# Patient Record
Sex: Male | Born: 1935 | Race: White | Hispanic: No | Smoking: Never smoker
Health system: Southern US, Community
[De-identification: ages and names within clinical notes are randomized; demographics above are authoritative.]

## PROBLEM LIST (undated history)

## (undated) DIAGNOSIS — E119 Type 2 diabetes mellitus without complications: Secondary | ICD-10-CM

## (undated) DIAGNOSIS — F0390 Unspecified dementia without behavioral disturbance: Secondary | ICD-10-CM

## (undated) DIAGNOSIS — I2699 Other pulmonary embolism without acute cor pulmonale: Secondary | ICD-10-CM

## (undated) HISTORY — PX: KIDNEY STONE SURGERY: SHX686

## (undated) HISTORY — PX: HERNIA REPAIR: SHX51

---

## 2021-02-13 ENCOUNTER — Other Ambulatory Visit: Payer: Self-pay

## 2021-02-13 DIAGNOSIS — F0390 Unspecified dementia without behavioral disturbance: Secondary | ICD-10-CM | POA: Insufficient documentation

## 2021-02-13 DIAGNOSIS — X58XXXA Exposure to other specified factors, initial encounter: Secondary | ICD-10-CM | POA: Diagnosis not present

## 2021-02-13 DIAGNOSIS — S5012XA Contusion of left forearm, initial encounter: Secondary | ICD-10-CM | POA: Diagnosis not present

## 2021-02-13 DIAGNOSIS — T148XXA Other injury of unspecified body region, initial encounter: Secondary | ICD-10-CM

## 2021-02-13 DIAGNOSIS — S4992XA Unspecified injury of left shoulder and upper arm, initial encounter: Secondary | ICD-10-CM | POA: Diagnosis present

## 2021-02-13 LAB — CBC WITH DIFFERENTIAL/PLATELET
Abs Immature Granulocytes: 0.01 10*3/uL (ref 0.00–0.07)
Basophils Absolute: 0 10*3/uL (ref 0.0–0.1)
Basophils Relative: 1 %
Eosinophils Absolute: 0.4 10*3/uL (ref 0.0–0.5)
Eosinophils Relative: 9 %
HCT: 33.8 % — ABNORMAL LOW (ref 39.0–52.0)
Hemoglobin: 11.8 g/dL — ABNORMAL LOW (ref 13.0–17.0)
Immature Granulocytes: 0 %
Lymphocytes Relative: 37 %
Lymphs Abs: 1.8 10*3/uL (ref 0.7–4.0)
MCH: 35.2 pg — ABNORMAL HIGH (ref 26.0–34.0)
MCHC: 34.9 g/dL (ref 30.0–36.0)
MCV: 100.9 fL — ABNORMAL HIGH (ref 80.0–100.0)
Monocytes Absolute: 0.5 10*3/uL (ref 0.1–1.0)
Monocytes Relative: 11 %
Neutro Abs: 2 10*3/uL (ref 1.7–7.7)
Neutrophils Relative %: 42 %
Platelets: 223 10*3/uL (ref 150–400)
RBC: 3.35 MIL/uL — ABNORMAL LOW (ref 4.22–5.81)
RDW: 12.2 % (ref 11.5–15.5)
WBC: 4.8 10*3/uL (ref 4.0–10.5)
nRBC: 0 % (ref 0.0–0.2)

## 2021-02-13 LAB — PROTIME-INR
INR: 2.4 — ABNORMAL HIGH (ref 0.8–1.2)
Prothrombin Time: 26 seconds — ABNORMAL HIGH (ref 11.4–15.2)

## 2021-02-13 NOTE — ED Triage Notes (Signed)
Pt from [facility] with left arm bruising. Pt has a collection of blood beneath skin noted to left distal forearm. Area is marked with marker. Pt with history of dementia, states he thinks began today.

## 2021-02-13 NOTE — ED Triage Notes (Signed)
Pt in via EMS from [facility]. Pt has dementia. Pt was talking to friends and staff saw some bruising on his arm and wanted him checked out. Pt takes blood thinners and has had no falls.

## 2021-02-13 NOTE — ED Provider Notes (Signed)
Emergency Medicine Provider Triage Evaluation Note  Andre Lewis , a 87 y.o. male  was evaluated in triage.  Pt complains of bruising to LUE, nontraumatic. Takes Plavix  Review of Systems  Positive: bruising Negative: pain  Physical Exam  BP 137/73   Pulse (!) 59   Temp 98.1 F (36.7 C) (Oral)   Resp 16   Ht 5\' 7"  (1.702 m)   Wt 72.6 kg   SpO2 99%   BMI 25.06 kg/m  Gen:   Awake, no distress   Resp:  Normal effort  MSK:   Moves extremities without difficulty; hematoma to left forearm Other:    Medical Decision Making  Medically screening exam initiated at 11:50 PM.  Appropriate orders placed.  Keilan Nichol was informed that the remainder of the evaluation will be completed by another provider, this initial triage assessment does not replace that evaluation, and the importance of remaining in the ED until their evaluation is complete.  87y/o M on Plavix presenting with nontraumatic LUE hematoma. Labs unremarkable; will obtain Rochel Brome soft tissue.   [place], MD 02/13/21 2351

## 2021-02-14 ENCOUNTER — Emergency Department: Payer: Medicare Other

## 2021-02-14 ENCOUNTER — Emergency Department
Admission: EM | Admit: 2021-02-14 | Discharge: 2021-02-14 | Disposition: A | Discharge status: Home / Self Care | Payer: Medicare Other | Attending: Emergency Medicine | Admitting: Emergency Medicine

## 2021-02-14 DIAGNOSIS — T148XXA Other injury of unspecified body region, initial encounter: Secondary | ICD-10-CM

## 2021-02-14 DIAGNOSIS — S5012XA Contusion of left forearm, initial encounter: Secondary | ICD-10-CM | POA: Diagnosis not present

## 2021-02-14 NOTE — ED Provider Notes (Signed)
[facility] Emergency Department Provider Note   ____________________________________________   Event Date/Time   First MD Initiated Contact with Patient 02/14/21 [phone]     (approximate)  I have reviewed the triage vital signs and the nursing notes.   HISTORY  Chief Complaint arm bruising    HPI Andre Lewis is a 87 y.o. male sent to the ED from [facility] with left arm bruising.  Patient with a history of dementia.  He happened to notice bruising on his left forearm today.  Denies known injury.  Takes anticoagulants but does not remember which one.  Staff marked the area with a marker and it has not extended beyond the lines over the course of the day.  Denies left forearm pain, swelling, warmth, redness.  Denies chest pain, shortness of breath, abdominal pain, nausea, vomiting or dizziness.     Past medical history Dementia  There are no problems to display for this patient.   Prior to Admission medications   Not on File    Allergies Patient has no known allergies.  No family history on file.  Social History    Review of Systems  Constitutional: No fever/chills Eyes: No visual changes. ENT: No sore throat. Cardiovascular: Denies chest pain. Respiratory: Denies shortness of breath. Gastrointestinal: No abdominal pain.  No nausea, no vomiting.  No diarrhea.  No constipation. Genitourinary: Negative for dysuria. Musculoskeletal: Positive for nontraumatic left forearm bruising.  Negative for back pain. Skin: Negative for rash. Neurological: Negative for headaches, focal weakness or numbness.   ____________________________________________   PHYSICAL EXAM:  VITAL SIGNS: ED Triage Vitals  Enc Vitals Group     BP 02/13/21 2125 137/73     Pulse Rate 02/13/21 2124 (!) 59     Resp 02/13/21 2124 16     Temp 02/13/21 2124 98.1 F (36.7 C)     Temp Source 02/13/21 2124 Oral     SpO2 02/13/21 2124 99 %     Weight 02/13/21 2125 160 lb (72.6 kg)      Height 02/13/21 2125 5\' 7"  (1.702 m)     Head Circumference --      Peak Flow --      Pain Score 02/13/21 2124 0     Pain Loc --      Pain Edu? --      Excl. in [place]? --     Constitutional: Alert and oriented.  Elderly appearing and in no acute distress. Eyes: Conjunctivae are normal. PERRL. EOMI. Head: Atraumatic. Nose: Atraumatic. Mouth/Throat: Mucous membranes are moist.  No dental malocclusion. Neck: No stridor.   Cardiovascular: Normal rate, regular rhythm. Grossly normal heart sounds.  Good peripheral circulation. Respiratory: Normal respiratory effort.  No retractions. Lungs CTAB. Gastrointestinal: Soft and nontender. No distention. No abdominal bruits. No CVA tenderness. Musculoskeletal:  LUE: Bruising noted to left lower forearm.  No fluctuance or induration palpated.  No warmth or erythema.  2+ radial pulse.  Brisk, less than 5-second capillary refill. No lower extremity tenderness nor edema.  No joint effusions. Neurologic:  Normal speech and language. No gross focal neurologic deficits are appreciated.  Skin:  Skin is warm, dry and intact. No rash noted. Psychiatric: Mood and affect are normal. Speech and behavior are normal.  ____________________________________________   LABS (all labs ordered are listed, but only abnormal results are displayed)  Labs Reviewed  CBC WITH DIFFERENTIAL/PLATELET - Abnormal; Notable for the following components:      Result Value   RBC 3.35 (*)  Hemoglobin 11.8 (*)    HCT 33.8 (*)    MCV 100.9 (*)    MCH 35.2 (*)    All other components within normal limits  PROTIME-INR - Abnormal; Notable for the following components:   Prothrombin Time 26.0 (*)    INR 2.4 (*)    All other components within normal limits   ____________________________________________  EKG  None ____________________________________________  RADIOLOGY I, Devlyn Parish J, personally viewed and evaluated these images (plain radiographs) as part of my medical  decision making, as well as reviewing the written report by the radiologist.  ED MD interpretation: Contusion  Official radiology report(s): [place] LT UPPER EXTREM LTD SOFT TISSUE NON VASCULAR  Result Date: 02/14/2021 CLINICAL DATA:  Left upper extremity hematoma for 1 day EXAM: ULTRASOUND LEFT UPPER EXTREMITY LIMITED TECHNIQUE: Ultrasound examination of the upper extremity soft tissues was performed in the area of clinical concern. COMPARISON:  None. FINDINGS: Targeted sonographic evaluation along the left anterior distal forearm to wrist in an area of purple discoloration. Thin region of subcutaneous hypoechogenicity measuring 5.5 x 3.6 cm and up to 0.4 mm in maximal thickness confined largely to the subcutaneous fat. Does not appear to reflect a discrete collection mass. IMPRESSION: Nonspecific hypoechogenicity confined to the subcutaneous tissues/subdermal layer most compatible with contusive change. No discrete mass or collection is evident. No concerning internal vascularity. Electronically Signed   By: Kreg Shropshire M.D.   On: 02/14/2021 00:46    ____________________________________________   PROCEDURES  Procedure(s) performed (including Critical Care):  Procedures   ____________________________________________   INITIAL IMPRESSION / ASSESSMENT AND PLAN / ED COURSE  As part of my medical decision making, I reviewed the following data within the electronic MEDICAL RECORD NUMBER Nursing notes reviewed and incorporated, Labs reviewed, Radiograph reviewed, and Notes from prior ED visits     87 year old male sent for left forearm contusion, on anticoagulants.  Differential diagnosis includes but is not limited to simple contusion, hematoma, abscess, etc.  Laboratory results noted.  Patient sent for soft tissue ultrasound of the area in question which does not demonstrate complex hematoma.  Strict return precautions given.  Patient verbalizes understanding and agrees with plan of care.       ____________________________________________   FINAL CLINICAL IMPRESSION(S) / ED DIAGNOSES  Final diagnoses:  Hematoma  Contusion of left forearm, initial encounter     ED Discharge Orders     None        Note:  This document was prepared using Dragon voice recognition software and may include unintentional dictation errors.    Irean Hong, MD 02/14/21 [phone]

## 2021-02-14 NOTE — ED Notes (Signed)
"  On the list" for [facility] for transport back to [facility]

## 2021-02-14 NOTE — Discharge Instructions (Signed)
You may apply ice to affected area several times daily.  Return to the ER for worsening symptoms, persistent vomiting, difficulty breathing or other concerns.

## 2021-02-14 NOTE — ED Notes (Signed)
Attempt to call report to [facility] multiple times, no answer to phone at [facility].

## 2021-08-10 ENCOUNTER — Emergency Department
Admission: EM | Admit: 2021-08-10 | Discharge: 2021-08-11 | Disposition: A | Discharge status: Home / Self Care | Payer: Medicare Other | Attending: Emergency Medicine | Admitting: Emergency Medicine

## 2021-08-10 ENCOUNTER — Other Ambulatory Visit: Payer: Self-pay

## 2021-08-10 DIAGNOSIS — R4689 Other symptoms and signs involving appearance and behavior: Secondary | ICD-10-CM

## 2021-08-10 DIAGNOSIS — Z7901 Long term (current) use of anticoagulants: Secondary | ICD-10-CM | POA: Insufficient documentation

## 2021-08-10 DIAGNOSIS — F0280 Dementia in other diseases classified elsewhere without behavioral disturbance: Secondary | ICD-10-CM | POA: Diagnosis not present

## 2021-08-10 DIAGNOSIS — G309 Alzheimer's disease, unspecified: Secondary | ICD-10-CM | POA: Insufficient documentation

## 2021-08-10 DIAGNOSIS — R456 Violent behavior: Secondary | ICD-10-CM | POA: Diagnosis present

## 2021-08-10 DIAGNOSIS — F03918 Unspecified dementia, unspecified severity, with other behavioral disturbance: Secondary | ICD-10-CM | POA: Diagnosis not present

## 2021-08-10 DIAGNOSIS — Z20822 Contact with and (suspected) exposure to covid-19: Secondary | ICD-10-CM | POA: Insufficient documentation

## 2021-08-10 DIAGNOSIS — E1165 Type 2 diabetes mellitus with hyperglycemia: Secondary | ICD-10-CM | POA: Insufficient documentation

## 2021-08-10 LAB — URINE DRUG SCREEN, QUALITATIVE (ARMC ONLY)
Amphetamines, Ur Screen: NOT DETECTED
Barbiturates, Ur Screen: NOT DETECTED
Benzodiazepine, Ur Scrn: NOT DETECTED
Cannabinoid 50 Ng, Ur ~~LOC~~: NOT DETECTED
Cocaine Metabolite,Ur ~~LOC~~: NOT DETECTED
MDMA (Ecstasy)Ur Screen: NOT DETECTED
Methadone Scn, Ur: NOT DETECTED
Opiate, Ur Screen: NOT DETECTED
Phencyclidine (PCP) Ur S: NOT DETECTED
Tricyclic, Ur Screen: NOT DETECTED

## 2021-08-10 LAB — URINALYSIS, ROUTINE W REFLEX MICROSCOPIC
Bacteria, UA: NONE SEEN
Bilirubin Urine: NEGATIVE
Glucose, UA: NEGATIVE mg/dL
Ketones, ur: NEGATIVE mg/dL
Leukocytes,Ua: NEGATIVE
Nitrite: NEGATIVE
Specific Gravity, Urine: 1.015 (ref 1.005–1.030)
Squamous Epithelial / HPF: NONE SEEN (ref 0–5)
pH: 7.5 (ref 5.0–8.0)

## 2021-08-10 LAB — COMPREHENSIVE METABOLIC PANEL
ALT: 14 U/L (ref 0–44)
AST: 17 U/L (ref 15–41)
Albumin: 3.5 g/dL (ref 3.5–5.0)
Alkaline Phosphatase: 73 U/L (ref 38–126)
Anion gap: 6 (ref 5–15)
BUN: 18 mg/dL (ref 8–23)
CO2: 29 mmol/L (ref 22–32)
Calcium: 9.4 mg/dL (ref 8.9–10.3)
Chloride: 101 mmol/L (ref 98–111)
Creatinine, Ser: 0.99 mg/dL (ref 0.61–1.24)
GFR, Estimated: >60 mL/min (ref >60)
Glucose, Bld: 184 mg/dL — ABNORMAL HIGH (ref 70–99)
Potassium: 4 mmol/L (ref 3.5–5.1)
Sodium: 136 mmol/L (ref 135–145)
Total Bilirubin: 0.4 mg/dL (ref 0.3–1.2)
Total Protein: 6.4 g/dL — ABNORMAL LOW (ref 6.5–8.1)

## 2021-08-10 LAB — CBC
HCT: 34.2 % — ABNORMAL LOW (ref 39.0–52.0)
Hemoglobin: 11.5 g/dL — ABNORMAL LOW (ref 13.0–17.0)
MCH: 34 pg (ref 26.0–34.0)
MCHC: 33.6 g/dL (ref 30.0–36.0)
MCV: 101.2 fL — ABNORMAL HIGH (ref 80.0–100.0)
Platelets: 170 10*3/uL (ref 150–400)
RBC: 3.38 MIL/uL — ABNORMAL LOW (ref 4.22–5.81)
RDW: 12 % (ref 11.5–15.5)
WBC: 4.2 10*3/uL (ref 4.0–10.5)
nRBC: 0 % (ref 0.0–0.2)

## 2021-08-10 LAB — CBG MONITORING, ED: Glucose-Capillary: 107 mg/dL — ABNORMAL HIGH (ref 70–99)

## 2021-08-10 LAB — SALICYLATE LEVEL: Salicylate Lvl: <7 mg/dL — ABNORMAL LOW (ref 7.0–30.0)

## 2021-08-10 LAB — RESP PANEL BY RT-PCR (FLU A&B, COVID) ARPGX2
Influenza A by PCR: NEGATIVE
Influenza B by PCR: NEGATIVE
SARS Coronavirus 2 by RT PCR: NEGATIVE

## 2021-08-10 LAB — ETHANOL: Alcohol, Ethyl (B): <10 mg/dL (ref <10)

## 2021-08-10 LAB — ACETAMINOPHEN LEVEL: Acetaminophen (Tylenol), Serum: <10 ug/mL — ABNORMAL LOW (ref 10–30)

## 2021-08-10 MED ORDER — INSULIN ASPART 100 UNIT/ML IJ SOLN: 0.0000 [IU] | Freq: Every day | INTRAMUSCULAR | Status: DC

## 2021-08-10 MED ORDER — TRAZODONE HCL 50 MG PO TABS
75.0000 mg | ORAL_TABLET | Freq: Every evening | ORAL | Status: DC
  Administered 2021-08-10: 75 mg via ORAL
  Filled 2021-08-10: qty 2

## 2021-08-10 MED ORDER — INSULIN ASPART 100 UNIT/ML IJ SOLN: 0.0000 [IU] | Freq: Three times a day (TID) | INTRAMUSCULAR | Status: DC

## 2021-08-10 MED ORDER — TAMSULOSIN HCL 0.4 MG PO CAPS
0.4000 mg | ORAL_CAPSULE | Freq: Every day | ORAL | Status: DC
  Administered 2021-08-10: 0.4 mg via ORAL
  Filled 2021-08-10: qty 1

## 2021-08-10 MED ORDER — RISPERIDONE 1 MG PO TABS
0.5000 mg | ORAL_TABLET | Freq: Once | ORAL | Status: AC
  Administered 2021-08-10: 0.5 mg via ORAL
  Filled 2021-08-10: qty 1

## 2021-08-10 MED ORDER — RIVAROXABAN 15 MG PO TABS
15.0000 mg | ORAL_TABLET | Freq: Every day | ORAL | Status: DC
  Administered 2021-08-10: 15 mg via ORAL
  Filled 2021-08-10: qty 1

## 2021-08-10 MED ORDER — FLUTICASONE PROPIONATE 50 MCG/ACT NA SUSP
1.0000 | Freq: Every day | NASAL | Status: DC
  Filled 2021-08-10: qty 16

## 2021-08-10 NOTE — ED Notes (Signed)
Pt in hallway bed.   

## 2021-08-10 NOTE — ED Notes (Signed)
This Rn tried to call [employer] twice to give report one time the secretary answered and the second time they did not answer they hung up.

## 2021-08-10 NOTE — ED Notes (Signed)
Sister marilyn notified of pt being here and pt being discharged

## 2021-08-10 NOTE — ED Notes (Signed)
VOL/  PENDING  CONSULT 
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
## 2021-08-10 NOTE — BH Assessment (Signed)
This Clinical research associate attempted to contact [facility] [phone]) for pt's discharge planning; however there was no answer. A HIPPA compliant message was left requesting a callback.
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
## 2021-08-10 NOTE — ED Notes (Signed)
Pt standing anxiously in front of the quad nurse desk and stating he wants to see the doctor. "Lavenia Atlas been here all day and seen no one". Pt redirected back to his bed

## 2021-08-10 NOTE — ED Notes (Signed)
Snack and soft drink was given °

## 2021-08-10 NOTE — ED Notes (Signed)
[facility] called to arrange transport to [facility]

## 2021-08-11 DIAGNOSIS — F03918 Unspecified dementia, unspecified severity, with other behavioral disturbance: Secondary | ICD-10-CM

## 2021-08-11 DIAGNOSIS — G309 Alzheimer's disease, unspecified: Secondary | ICD-10-CM

## 2021-08-11 DIAGNOSIS — F0280 Dementia in other diseases classified elsewhere without behavioral disturbance: Secondary | ICD-10-CM

## 2021-08-11 LAB — HEMOGLOBIN A1C
Hgb A1c MFr Bld: 7.3 % — ABNORMAL HIGH (ref 4.8–5.6)
Mean Plasma Glucose: 163 mg/dL
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
## 2021-11-25 ENCOUNTER — Other Ambulatory Visit: Payer: Self-pay

## 2021-11-25 ENCOUNTER — Emergency Department: Payer: Medicare Other

## 2021-11-25 ENCOUNTER — Inpatient Hospital Stay
Admission: EM | Admit: 2021-11-25 | Discharge: 2021-12-03 | DRG: 689 | Disposition: A | Discharge status: Hospice | Payer: Medicare Other | Source: Skilled Nursing Facility | Attending: Internal Medicine | Admitting: Internal Medicine

## 2021-11-25 ENCOUNTER — Encounter: Payer: Self-pay | Admitting: Internal Medicine

## 2021-11-25 DIAGNOSIS — L8990 Pressure ulcer of unspecified site, unspecified stage: Secondary | ICD-10-CM | POA: Diagnosis present

## 2021-11-25 DIAGNOSIS — R627 Adult failure to thrive: Secondary | ICD-10-CM

## 2021-11-25 DIAGNOSIS — Z7901 Long term (current) use of anticoagulants: Secondary | ICD-10-CM

## 2021-11-25 DIAGNOSIS — N390 Urinary tract infection, site not specified: Secondary | ICD-10-CM | POA: Diagnosis not present

## 2021-11-25 DIAGNOSIS — E119 Type 2 diabetes mellitus without complications: Secondary | ICD-10-CM | POA: Diagnosis not present

## 2021-11-25 DIAGNOSIS — I5031 Acute diastolic (congestive) heart failure: Secondary | ICD-10-CM | POA: Clinically undetermined

## 2021-11-25 DIAGNOSIS — G9341 Metabolic encephalopathy: Secondary | ICD-10-CM | POA: Diagnosis not present

## 2021-11-25 DIAGNOSIS — G309 Alzheimer's disease, unspecified: Secondary | ICD-10-CM | POA: Diagnosis present

## 2021-11-25 DIAGNOSIS — Z6820 Body mass index (BMI) 20.0-20.9, adult: Secondary | ICD-10-CM

## 2021-11-25 DIAGNOSIS — Z515 Encounter for palliative care: Secondary | ICD-10-CM

## 2021-11-25 DIAGNOSIS — W19XXXA Unspecified fall, initial encounter: Secondary | ICD-10-CM | POA: Diagnosis present

## 2021-11-25 DIAGNOSIS — F03918 Unspecified dementia, unspecified severity, with other behavioral disturbance: Secondary | ICD-10-CM | POA: Diagnosis present

## 2021-11-25 DIAGNOSIS — I110 Hypertensive heart disease with heart failure: Secondary | ICD-10-CM | POA: Diagnosis present

## 2021-11-25 DIAGNOSIS — N179 Acute kidney failure, unspecified: Secondary | ICD-10-CM | POA: Diagnosis not present

## 2021-11-25 DIAGNOSIS — Z7189 Other specified counseling: Secondary | ICD-10-CM

## 2021-11-25 DIAGNOSIS — R410 Disorientation, unspecified: Secondary | ICD-10-CM | POA: Diagnosis not present

## 2021-11-25 DIAGNOSIS — F05 Delirium due to known physiological condition: Secondary | ICD-10-CM | POA: Diagnosis present

## 2021-11-25 DIAGNOSIS — E43 Unspecified severe protein-calorie malnutrition: Secondary | ICD-10-CM | POA: Insufficient documentation

## 2021-11-25 DIAGNOSIS — I10 Essential (primary) hypertension: Secondary | ICD-10-CM | POA: Diagnosis present

## 2021-11-25 DIAGNOSIS — I2699 Other pulmonary embolism without acute cor pulmonale: Secondary | ICD-10-CM | POA: Diagnosis present

## 2021-11-25 DIAGNOSIS — Y92129 Unspecified place in nursing home as the place of occurrence of the external cause: Secondary | ICD-10-CM

## 2021-11-25 DIAGNOSIS — Z8249 Family history of ischemic heart disease and other diseases of the circulatory system: Secondary | ICD-10-CM

## 2021-11-25 DIAGNOSIS — Z86711 Personal history of pulmonary embolism: Secondary | ICD-10-CM

## 2021-11-25 DIAGNOSIS — L89323 Pressure ulcer of left buttock, stage 3: Secondary | ICD-10-CM | POA: Diagnosis present

## 2021-11-25 DIAGNOSIS — Z66 Do not resuscitate: Secondary | ICD-10-CM | POA: Diagnosis present

## 2021-11-25 DIAGNOSIS — I5032 Chronic diastolic (congestive) heart failure: Secondary | ICD-10-CM

## 2021-11-25 DIAGNOSIS — Z79899 Other long term (current) drug therapy: Secondary | ICD-10-CM

## 2021-11-25 DIAGNOSIS — S50311A Abrasion of right elbow, initial encounter: Secondary | ICD-10-CM | POA: Diagnosis present

## 2021-11-25 DIAGNOSIS — Z9181 History of falling: Secondary | ICD-10-CM

## 2021-11-25 DIAGNOSIS — U071 COVID-19: Secondary | ICD-10-CM | POA: Diagnosis not present

## 2021-11-25 DIAGNOSIS — L89892 Pressure ulcer of other site, stage 2: Secondary | ICD-10-CM | POA: Diagnosis present

## 2021-11-25 DIAGNOSIS — L89623 Pressure ulcer of left heel, stage 3: Secondary | ICD-10-CM | POA: Diagnosis present

## 2021-11-25 DIAGNOSIS — I5033 Acute on chronic diastolic (congestive) heart failure: Secondary | ICD-10-CM | POA: Diagnosis present

## 2021-11-25 DIAGNOSIS — F02811 Dementia in other diseases classified elsewhere, unspecified severity, with agitation: Secondary | ICD-10-CM | POA: Diagnosis present

## 2021-11-25 DIAGNOSIS — F0280 Dementia in other diseases classified elsewhere without behavioral disturbance: Secondary | ICD-10-CM | POA: Diagnosis present

## 2021-11-25 DIAGNOSIS — B965 Pseudomonas (aeruginosa) (mallei) (pseudomallei) as the cause of diseases classified elsewhere: Secondary | ICD-10-CM | POA: Diagnosis present

## 2021-11-25 HISTORY — DX: Unspecified dementia, unspecified severity, without behavioral disturbance, psychotic disturbance, mood disturbance, and anxiety: F03.90

## 2021-11-25 HISTORY — DX: Type 2 diabetes mellitus without complications: E11.9

## 2021-11-25 HISTORY — DX: Other pulmonary embolism without acute cor pulmonale: I26.99

## 2021-11-25 LAB — URINALYSIS, ROUTINE W REFLEX MICROSCOPIC
Bacteria, UA: NONE SEEN
Bilirubin Urine: NEGATIVE
Glucose, UA: 50 mg/dL — AB
Ketones, ur: 20 mg/dL — AB
Nitrite: NEGATIVE
Protein, ur: 100 mg/dL — AB
Specific Gravity, Urine: 1.02 (ref 1.005–1.030)
pH: 6 (ref 5.0–8.0)

## 2021-11-25 LAB — COMPREHENSIVE METABOLIC PANEL
ALT: 18 U/L (ref 0–44)
AST: 24 U/L (ref 15–41)
Albumin: 3.6 g/dL (ref 3.5–5.0)
Alkaline Phosphatase: 74 U/L (ref 38–126)
Anion gap: 9 (ref 5–15)
BUN: 22 mg/dL (ref 8–23)
CO2: 24 mmol/L (ref 22–32)
Calcium: 9.5 mg/dL (ref 8.9–10.3)
Chloride: 101 mmol/L (ref 98–111)
Creatinine, Ser: 1 mg/dL (ref 0.61–1.24)
GFR, Estimated: >60 mL/min (ref >60)
Glucose, Bld: 179 mg/dL — ABNORMAL HIGH (ref 70–99)
Potassium: 3.7 mmol/L (ref 3.5–5.1)
Sodium: 134 mmol/L — ABNORMAL LOW (ref 135–145)
Total Bilirubin: 0.6 mg/dL (ref 0.3–1.2)
Total Protein: 6.9 g/dL (ref 6.5–8.1)

## 2021-11-25 LAB — CBC
HCT: 33.7 % — ABNORMAL LOW (ref 39.0–52.0)
Hemoglobin: 11.6 g/dL — ABNORMAL LOW (ref 13.0–17.0)
MCH: 34.5 pg — ABNORMAL HIGH (ref 26.0–34.0)
MCHC: 34.4 g/dL (ref 30.0–36.0)
MCV: 100.3 fL — ABNORMAL HIGH (ref 80.0–100.0)
Platelets: 170 10*3/uL (ref 150–400)
RBC: 3.36 MIL/uL — ABNORMAL LOW (ref 4.22–5.81)
RDW: 11.9 % (ref 11.5–15.5)
WBC: 6.7 10*3/uL (ref 4.0–10.5)
nRBC: 0 % (ref 0.0–0.2)

## 2021-11-25 LAB — GLUCOSE, CAPILLARY: Glucose-Capillary: 155 mg/dL — ABNORMAL HIGH (ref 70–99)

## 2021-11-25 LAB — CK: Total CK: 243 U/L (ref 49–397)

## 2021-11-25 MED ORDER — RIVAROXABAN 15 MG PO TABS
15.0000 mg | ORAL_TABLET | Freq: Every day | ORAL | Status: DC
  Administered 2021-11-25 – 2021-11-30 (×6): 15 mg via ORAL
  Filled 2021-11-25 (×7): qty 1

## 2021-11-25 MED ORDER — HALOPERIDOL LACTATE 5 MG/ML IJ SOLN
2.0000 mg | Freq: Once | INTRAMUSCULAR | Status: AC
Start: 2021-11-25 — End: 2021-11-25
  Administered 2021-11-25: 2 mg via INTRAVENOUS
  Filled 2021-11-25: qty 1

## 2021-11-25 MED ORDER — SODIUM CHLORIDE 0.9 % IV BOLUS
1000.0000 mL | Freq: Once | INTRAVENOUS | Status: DC
Start: 2021-11-25 — End: 2021-11-25

## 2021-11-25 MED ORDER — TRAZODONE HCL 50 MG PO TABS
75.0000 mg | ORAL_TABLET | Freq: Every day | ORAL | Status: DC
  Administered 2021-11-25 – 2021-12-02 (×8): 75 mg via ORAL
  Filled 2021-11-25 (×8): qty 2

## 2021-11-25 MED ORDER — TAMSULOSIN HCL 0.4 MG PO CAPS
0.4000 mg | ORAL_CAPSULE | Freq: Every day | ORAL | Status: DC
Start: 2021-11-26 — End: 2021-12-01
  Administered 2021-11-26 – 2021-12-01 (×6): 0.4 mg via ORAL
  Filled 2021-11-25 (×6): qty 1

## 2021-11-25 MED ORDER — ACETAMINOPHEN 325 MG PO TABS
650.0000 mg | ORAL_TABLET | Freq: Four times a day (QID) | ORAL | Status: DC | PRN
  Administered 2021-11-26 – 2021-12-01 (×3): 650 mg via ORAL
  Filled 2021-11-25 (×4): qty 2

## 2021-11-25 MED ORDER — ONDANSETRON HCL 4 MG/2ML IJ SOLN: 4.0000 mg | Freq: Three times a day (TID) | INTRAMUSCULAR | Status: DC | PRN

## 2021-11-25 MED ORDER — DIVALPROEX SODIUM 125 MG PO DR TAB
125.0000 mg | DELAYED_RELEASE_TABLET | Freq: Every day | ORAL | Status: DC
  Administered 2021-11-26 – 2021-12-03 (×8): 125 mg via ORAL
  Filled 2021-11-25 (×8): qty 1

## 2021-11-25 MED ORDER — HYDRALAZINE HCL 20 MG/ML IJ SOLN
5.0000 mg | INTRAMUSCULAR | Status: DC | PRN
  Administered 2021-11-25 (×2): 5 mg via INTRAVENOUS
  Filled 2021-11-25 (×2): qty 1

## 2021-11-25 MED ORDER — SODIUM CHLORIDE 0.9 % IV BOLUS
1000.0000 mL | Freq: Once | INTRAVENOUS | Status: AC
  Administered 2021-11-25: 1000 mL via INTRAVENOUS

## 2021-11-25 MED ORDER — ENOXAPARIN SODIUM 40 MG/0.4ML IJ SOSY
40.0000 mg | PREFILLED_SYRINGE | INTRAMUSCULAR | Status: DC
Start: 2021-11-25 — End: 2021-11-25
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
## 2021-11-25 NOTE — Assessment & Plan Note (Addendum)
Recent A1c 7.3. 

## 2021-11-25 NOTE — H&P (Signed)
?History and Physical  ? ? ?[name] [id number] DOB: 1932/05/23 DOA: 11/25/2021 ? ?Referring MD/NP/PA:  ? ?PCP: System, Provider Not In  ? ?Patient coming from:  The patient is coming from SNF   ? ?Chief Complaint: AMS and fall ? ?HPI: Andre Lewis is a 88 y.o. male with medical history significant of diet-controlled diabetes, dementia with aggressive behavior, fall, pulmonary embolism on Xarelto, who presents with altered mental status and fall. ? ?Per his sister-in law at bedside, pt had fell twice in SNF last night. He was found on the ground this morning. Possibly hit his head in fall. At normal baseline, patient recognize family members, usually not orientated to time and place.  Patient is more confused than baseline per his sister-in law.  Currently patient is not oriented x3.  He moves all extremities.  He is agitated and restless in ED. Does not seem to have chest pain, abdominal pain.  No active respiratory distress, cough, nausea, vomiting, diarrhea noted.  Not sure if patient has symptoms of UTI. ? ?Data Reviewed and ED Course: pt was found to have WBC 6.7, CK 243, electrolytes renal function okay, urinalysis (cloudy appearance, small amount of leukocyte, negative bacteria, WBC 0-5), temperature normal, blood pressure 157/83, heart rate 106, RR 18, oxygen saturation 96% on room air.  CT of head is negative.  CT of C-spine is negative for acute injury, but showed degenerative disc disease.  Patient is placed on telemetry bed for observation ? ? ?EKG: Not done in ED, will get one.    ? ? ?Review of Systems: Could not be reviewed due to altered mental status. ? ? ? ?Allergy: No Known Allergies ? ?Past Medical History:  ?Diagnosis Date  ? Dementia ([place])   ? Diabetes mellitus without complication ([place])   ? Pulmonary embolism ([place])   ? ? ?Past Surgical History:  ?Procedure Laterality Date  ? HERNIA REPAIR    ? KIDNEY STONE SURGERY    ? ? ?Social History:  reports that he has never smoked. He has never  used smokeless tobacco. He reports that he does not use drugs. No history on file for alcohol use. ? ?Family History:  ?Family History  ?Problem Relation Age of Onset  ? Heart failure Mother   ? Heart attack Father   ?  ? ?Prior to Admission medications   ?Medication Sig Start Date End Date Taking? Authorizing Provider  ?fluticasone (FLONASE) 50 MCG/ACT nasal spray Place 1 spray into both nostrils daily. 07/14/21   [provider]  ?tamsulosin (FLOMAX) 0.4 MG CAPS capsule Take 0.4 mg by mouth daily. 07/29/21   [provider]  ?traMADol (ULTRAM) 50 MG tablet Take 50 mg by mouth 2 (two) times daily as needed for pain. 03/31/21   [provider]  ?traZODone (DESYREL) 50 MG tablet Take 75 mg by mouth every evening. 08/05/21   [provider]  ?XARELTO 15 MG TABS tablet Take 15 mg by mouth daily. 07/29/21   [provider]  ? ? ?Physical Exam: ?Vitals:  ? 11/25/21 1041 11/25/21 1200 11/25/21 1330 11/25/21 1622  ?BP: (!) 150/80 (!) 144/82 (!) 157/83 (!) 195/106  ?Pulse: 88 97 (!) 106 (!) 108  ?Resp: 18 18 18 17   ?Temp:    98.1 ?F (36.7 ?C)  ?TempSrc:      ?SpO2: 93% 96% 100% 95%  ?Weight:      ?Height:      ? ?General: Not in acute distress ?HEENT: ?  Eyes: PERRL, EOMI, no scleral icterus. ?      ENT: No discharge from the ears and nose. ?      Neck: No JVD, no bruit, no mass felt. ?Heme: No neck lymph node enlargement. ?Cardiac: S1/S2, RRR, No murmurs, No gallops or rubs. ?Respiratory: No rales, wheezing, rhonchi or rubs. ?GI: Soft, nondistended, nontender, no organomegaly, BS present. ?GU: No hematuria ?Ext: No pitting leg edema bilaterally. 1+DP/PT pulse bilaterally. ?Musculoskeletal: No joint deformities, No joint redness or warmth, no limitation of ROM in spin. ?Skin: No rashes.  ?Neuro: confused, agitated, not oriented X3, not following command.  Cranial nerves II-XII grossly intact, moves all extremities normally. ?Psych: Patient is not psychotic, no suicidal or hemocidal  ideation. ? ?Labs on Admission: I have personally reviewed following labs and imaging studies ? ?CBC: ?Recent Labs  ?Lab 11/25/21 ?[id number]  ?WBC 6.7  ?HGB 11.6*  ?HCT 33.7*  ?MCV 100.3*  ?PLT 170  ? ?Basic Metabolic Panel: ?Recent Labs  ?Lab 11/25/21 ?[id number]  ?NA 134*  ?K 3.7  ?CL 101  ?CO2 24  ?GLUCOSE 179*  ?BUN 22  ?CREATININE 1.00  ?CALCIUM 9.5  ? ?GFR: ?Estimated Creatinine Clearance: 46.9 mL/min (by C-G formula based on SCr of 1 mg/dL). ?Liver Function Tests: ?Recent Labs  ?Lab 11/25/21 ?[id number]  ?AST 24  ?ALT 18  ?ALKPHOS 74  ?BILITOT 0.6  ?PROT 6.9  ?ALBUMIN 3.6  ? ?No results for input(s): LIPASE, AMYLASE in the last 168 hours. ?No results for input(s): AMMONIA in the last 168 hours. ?Coagulation Profile: ?No results for input(s): INR, PROTIME in the last 168 hours. ?Cardiac Enzymes: ?Recent Labs  ?Lab 11/25/21 ?0724  ?CKTOTAL 243  ? ?BNP (last 3 results) ?No results for input(s): PROBNP in the last 8760 hours. ?HbA1C: ?No results for input(s): HGBA1C in the last 72 hours. ?CBG: ?No results for input(s): GLUCAP in the last 168 hours. ?Lipid Profile: ?No results for input(s): CHOL, HDL, LDLCALC, TRIG, CHOLHDL, LDLDIRECT in the last 72 hours. ?Thyroid Function Tests: ?No results for input(s): TSH, T4TOTAL, FREET4, T3FREE, THYROIDAB in the last 72 hours. ?Anemia Panel: ?No results for input(s): VITAMINB12, FOLATE, FERRITIN, TIBC, IRON, RETICCTPCT in the last 72 hours. ?Urine analysis: ?   ?Component Value Date/Time  ? COLORURINE YELLOW (A) 11/25/2021 1146  ? APPEARANCEUR CLOUDY (A) 11/25/2021 1146  ? LABSPEC 1.020 11/25/2021 1146  ? PHURINE 6.0 11/25/2021 1146  ? GLUCOSEU 50 (A) 11/25/2021 1146  ? HGBUR SMALL (A) 11/25/2021 1146  ? [place] NEGATIVE 11/25/2021 1146  ? KETONESUR 20 (A) 11/25/2021 1146  ? PROTEINUR 100 (A) 11/25/2021 1146  ? NITRITE NEGATIVE 11/25/2021 1146  ? LEUKOCYTESUR SMALL (A) 11/25/2021 1146  ? ?Sepsis Labs: ?@LABRCNTIP (procalcitonin:4,lacticidven:4) ?)No results found for this or any previous  visit (from the past 240 hour(s)).  ? ?Radiological Exams on Admission: ?CT HEAD WO CONTRAST (5MM) ? ?Result Date: 11/25/2021 ?CLINICAL DATA:  88 year old male status post witnessed fall. EXAM: CT HEAD WITHOUT CONTRAST TECHNIQUE: Contiguous axial images were obtained from the base of the skull through the vertex without intravenous contrast. RADIATION DOSE REDUCTION: This exam was performed according to the departmental dose-optimization program which includes automated exposure control, adjustment of the mA and/or kV according to patient size and/or use of iterative reconstruction technique. COMPARISON:  None Available. FINDINGS: Study is moderately degraded by motion artifact despite repeated imaging attempts. Brain: No midline shift, mass effect, or evidence of intracranial mass lesion. No ventriculomegaly. No acute intracranial hemorrhage identified. No definite acute cortically based infarct. Vascular:  Calcified atherosclerosis at the skull base. No suspicious intracranial vascular hyperdensity. Skull: Degraded by motion.  No acute osseous abnormality identified. Sinuses/Orbits: Scattered bilateral mucoperiosteal thickening, most pronounced in the ethmoid and sphenoid sinuses. Tympanic cavities and mastoids appear clear. Other: No definite orbit or scalp soft tissue injury. IMPRESSION: 1. Study is moderately degraded by motion artifact despite repeated imaging attempts. 2. No acute intracranial abnormality or acute traumatic injury identified. 3. Bilateral paranasal sinus disease. Electronically Signed   By: Genevie Ann M.D.   On: 11/25/2021 07:45  ? ?CT Cervical Spine Wo Contrast ? ?Result Date: 11/25/2021 ?CLINICAL DATA:  88 year old male status post witnessed fall. EXAM: CT CERVICAL SPINE WITHOUT CONTRAST TECHNIQUE: Multidetector CT imaging of the cervical spine was performed without intravenous contrast. Multiplanar CT image reconstructions were also generated. RADIATION DOSE REDUCTION: This exam was performed  according to the departmental dose-optimization program which includes automated exposure control, adjustment of the mA and/or kV according to patient size and/or use of iterative reconstruction techniqu

## 2021-11-25 NOTE — Assessment & Plan Note (Addendum)
Secondary to UTI.  Treated

## 2021-11-25 NOTE — Assessment & Plan Note (Addendum)
-   Fall precaution 

## 2021-11-25 NOTE — ED Notes (Signed)
Male purewick placed at this time.  

## 2021-11-25 NOTE — ED Notes (Signed)
Patient brief wet. Patient cleaned and new purewick placed at this time. ?

## 2021-11-25 NOTE — Assessment & Plan Note (Addendum)
Patient will be on comfort care/hospice.  Stop Xarelto as this is a very old history of PE and no benefit at this point being on comfort care

## 2021-11-25 NOTE — ED Provider Notes (Signed)
? ?[facility] ?Provider Note ? ? ? Event Date/Time  ? First MD Initiated Contact with Patient 11/25/21 0708   ?  (approximate) ? ?History  ? ?Chief Complaint: Fall (According to the EMS, pt had an unwitnessed fall and possibly hit his head. Pt has a hx of falls and dementia.) ? ?HPI ? ?Andre Lewis is a 88 y.o. male with a past medical history of dementia presents to the emergency department from his nursing facility after a fall.  According to EMS report per nurse patient was found on the ground at his nursing facility this morning.  Here the patient initially somnolent but awakens to voice.  No acute distress.  Patient does not know why he is here.  Cannot contribute to his history. ? ?Physical Exam  ? ?Triage Vital Signs: ?ED Triage Vitals  ?Enc Vitals Group  ?   BP 11/25/21 0637 (!) 154/84  ?   Pulse Rate 11/25/21 0637 (!) 102  ?   Resp 11/25/21 0637 18  ?   Temp 11/25/21 0637 97.9 ?F (36.6 ?C)  ?   Temp Source 11/25/21 0637 Oral  ?   SpO2 11/25/21 0632 92 %  ?   Weight 11/25/21 0638 143 lb 3.2 oz (65 kg)  ?   Height 11/25/21 0638 5\' 10"  (1.778 m)  ?   Head Circumference --   ?   Peak Flow --   ?   Pain Score 11/25/21 0638 0  ?   Pain Loc --   ?   Pain Edu? --   ?   Excl. in [place]? --   ? ? ?Most recent vital signs: ?Vitals:  ? 11/25/21 0632 11/25/21 0637  ?BP:  (!) 154/84  ?Pulse:  (!) 102  ?Resp:  18  ?Temp:  97.9 ?F (36.6 ?C)  ?SpO2: 92% 95%  ? ? ?General: Awake, no distress.  No signs of head trauma on my exam. ?CV:  Good peripheral perfusion.  Regular rate and rhythm  ?Resp:  Normal effort.  Equal breath sounds bilaterally.  ?Abd:  No distention.  Soft, nontender.  No rebound or guarding. ?Other:  Great range of motion all extremities with no pain elicited.  No neck or back tenderness to palpation. ? ? ?ED Results / Procedures / Treatments  ? ?RADIOLOGY ? ?I personally reviewed the CT images.  On my interpretation patient has large ventricles but no obvious large bleed. ?Radiology has  read the CT scan is negative however limited due to motion. ? ?MEDICATIONS ORDERED IN ED: ?Medications - No data to display ? ? ?IMPRESSION / MDM / ASSESSMENT AND PLAN / ED COURSE  ?I reviewed the triage vital signs and the nursing notes. ? ?Patient presents emergency department for an unwitnessed fall at his nursing facility.  Per report patient has a history of dementia.  Here the patient does not recall why he is here does not recall the fall.  We will check basic labs and a CT scan of the head and C-spine as precaution as the patient is not able to give a history or reliable exam.  Good range of motion in all extremities, benign abdominal exam. ? ?Patient CBC shows no significant abnormality, chemistry is reassuring.  CT scan head does not appear to show any abnormality however there is motion artifact. ? ?Patient's sister-in-law who is also the power of attorney is now here in the emergency department.  She states the patient does have dementia at baseline but is normally  much more alert and talkative.  Patient remains somewhat somnolent will awaken to voice but then falls asleep if not actively engaged.  We will IV hydrate, still awaiting a urinalysis sample. ? ?Patient CT scan is normal.  Urine shows no concerning findings.  CBC and chemistry show no concerning findings.  However the sister-in-law who is here with the patient sees the patient regularly states the patient is not himself.  Remains extremely confused is not able to converse is tremulous.  Given the patient's continued confusion consistent with acute delirium we will admit to the hospital service for ongoing monitoring, and work-up. ? ?FINAL CLINICAL IMPRESSION(S) / ED DIAGNOSES  ? ?Fall ?Delirium ? ?Note:  This document was prepared using Dragon voice recognition software and may include unintentional dictation errors. ?  ?Harvest Dark, MD ?11/25/21 1346 ? ?

## 2021-11-25 NOTE — Assessment & Plan Note (Signed)
Patient has aggressive behavior disturbance ?-Continue Depakote, ?

## 2021-11-26 DIAGNOSIS — Z8249 Family history of ischemic heart disease and other diseases of the circulatory system: Secondary | ICD-10-CM | POA: Diagnosis not present

## 2021-11-26 DIAGNOSIS — F03918 Unspecified dementia, unspecified severity, with other behavioral disturbance: Secondary | ICD-10-CM | POA: Diagnosis not present

## 2021-11-26 DIAGNOSIS — G9341 Metabolic encephalopathy: Secondary | ICD-10-CM | POA: Diagnosis present

## 2021-11-26 DIAGNOSIS — R410 Disorientation, unspecified: Secondary | ICD-10-CM | POA: Diagnosis present

## 2021-11-26 DIAGNOSIS — E119 Type 2 diabetes mellitus without complications: Secondary | ICD-10-CM | POA: Diagnosis present

## 2021-11-26 DIAGNOSIS — S50311A Abrasion of right elbow, initial encounter: Secondary | ICD-10-CM | POA: Diagnosis present

## 2021-11-26 DIAGNOSIS — Y92129 Unspecified place in nursing home as the place of occurrence of the external cause: Secondary | ICD-10-CM | POA: Diagnosis not present

## 2021-11-26 DIAGNOSIS — Z9181 History of falling: Secondary | ICD-10-CM | POA: Diagnosis not present

## 2021-11-26 DIAGNOSIS — Z6820 Body mass index (BMI) 20.0-20.9, adult: Secondary | ICD-10-CM | POA: Diagnosis not present

## 2021-11-26 DIAGNOSIS — N390 Urinary tract infection, site not specified: Secondary | ICD-10-CM | POA: Diagnosis present

## 2021-11-26 DIAGNOSIS — N179 Acute kidney failure, unspecified: Secondary | ICD-10-CM | POA: Diagnosis not present

## 2021-11-26 DIAGNOSIS — L89323 Pressure ulcer of left buttock, stage 3: Secondary | ICD-10-CM | POA: Diagnosis present

## 2021-11-26 DIAGNOSIS — R627 Adult failure to thrive: Secondary | ICD-10-CM | POA: Diagnosis present

## 2021-11-26 DIAGNOSIS — L89623 Pressure ulcer of left heel, stage 3: Secondary | ICD-10-CM | POA: Diagnosis present

## 2021-11-26 DIAGNOSIS — U071 COVID-19: Secondary | ICD-10-CM | POA: Diagnosis not present

## 2021-11-26 DIAGNOSIS — F05 Delirium due to known physiological condition: Secondary | ICD-10-CM | POA: Diagnosis present

## 2021-11-26 DIAGNOSIS — G309 Alzheimer's disease, unspecified: Secondary | ICD-10-CM | POA: Diagnosis present

## 2021-11-26 DIAGNOSIS — Z7901 Long term (current) use of anticoagulants: Secondary | ICD-10-CM | POA: Diagnosis not present

## 2021-11-26 DIAGNOSIS — I5031 Acute diastolic (congestive) heart failure: Secondary | ICD-10-CM | POA: Diagnosis not present

## 2021-11-26 DIAGNOSIS — I10 Essential (primary) hypertension: Secondary | ICD-10-CM | POA: Diagnosis not present

## 2021-11-26 DIAGNOSIS — N3000 Acute cystitis without hematuria: Secondary | ICD-10-CM | POA: Diagnosis not present

## 2021-11-26 DIAGNOSIS — E43 Unspecified severe protein-calorie malnutrition: Secondary | ICD-10-CM | POA: Diagnosis present

## 2021-11-26 DIAGNOSIS — I5033 Acute on chronic diastolic (congestive) heart failure: Secondary | ICD-10-CM | POA: Diagnosis present

## 2021-11-26 DIAGNOSIS — F02811 Dementia in other diseases classified elsewhere, unspecified severity, with agitation: Secondary | ICD-10-CM | POA: Diagnosis present

## 2021-11-26 DIAGNOSIS — W19XXXA Unspecified fall, initial encounter: Secondary | ICD-10-CM | POA: Diagnosis present

## 2021-11-26 DIAGNOSIS — Z66 Do not resuscitate: Secondary | ICD-10-CM | POA: Diagnosis present

## 2021-11-26 DIAGNOSIS — B965 Pseudomonas (aeruginosa) (mallei) (pseudomallei) as the cause of diseases classified elsewhere: Secondary | ICD-10-CM | POA: Diagnosis present

## 2021-11-26 DIAGNOSIS — I110 Hypertensive heart disease with heart failure: Secondary | ICD-10-CM | POA: Diagnosis present

## 2021-11-26 DIAGNOSIS — L89892 Pressure ulcer of other site, stage 2: Secondary | ICD-10-CM | POA: Diagnosis present

## 2021-11-26 DIAGNOSIS — Z86711 Personal history of pulmonary embolism: Secondary | ICD-10-CM | POA: Diagnosis not present

## 2021-11-26 DIAGNOSIS — Z515 Encounter for palliative care: Secondary | ICD-10-CM | POA: Diagnosis not present

## 2021-11-26 LAB — GLUCOSE, CAPILLARY
Glucose-Capillary: 179 mg/dL — ABNORMAL HIGH (ref 70–99)
Glucose-Capillary: 175 mg/dL — ABNORMAL HIGH (ref 70–99)
Glucose-Capillary: 167 mg/dL — ABNORMAL HIGH (ref 70–99)

## 2021-11-26 LAB — BASIC METABOLIC PANEL
Anion gap: 11 (ref 5–15)
BUN: 21 mg/dL (ref 8–23)
CO2: 23 mmol/L (ref 22–32)
Calcium: 9.3 mg/dL (ref 8.9–10.3)
Chloride: 102 mmol/L (ref 98–111)
Creatinine, Ser: 1.67 mg/dL — ABNORMAL HIGH (ref 0.61–1.24)
GFR, Estimated: 39 mL/min — ABNORMAL LOW (ref >60)
Glucose, Bld: 195 mg/dL — ABNORMAL HIGH (ref 70–99)
Potassium: 3.7 mmol/L (ref 3.5–5.1)
Sodium: 136 mmol/L (ref 135–145)

## 2021-11-26 LAB — BRAIN NATRIURETIC PEPTIDE: B Natriuretic Peptide: 345.5 pg/mL — ABNORMAL HIGH (ref 0.0–100.0)

## 2021-11-26 LAB — PROCALCITONIN: Procalcitonin: 0.15 ng/mL

## 2021-11-26 MED ORDER — FUROSEMIDE 10 MG/ML IJ SOLN
20.0000 mg | Freq: Two times a day (BID) | INTRAMUSCULAR | Status: DC
  Administered 2021-11-26: 20 mg via INTRAVENOUS
  Filled 2021-11-26: qty 2

## 2021-11-26 MED ORDER — INSULIN ASPART 100 UNIT/ML IJ SOLN
0.0000 [IU] | Freq: Three times a day (TID) | INTRAMUSCULAR | Status: DC
  Administered 2021-11-27 (×3): 1 [IU] via SUBCUTANEOUS
  Administered 2021-11-28: 2 [IU] via SUBCUTANEOUS
  Administered 2021-11-28 – 2021-11-29 (×2): 1 [IU] via SUBCUTANEOUS
  Administered 2021-11-29: 2 [IU] via SUBCUTANEOUS
  Administered 2021-11-29: 1 [IU] via SUBCUTANEOUS
  Administered 2021-11-30 (×2): 2 [IU] via SUBCUTANEOUS
  Administered 2021-11-30 – 2021-12-01 (×2): 1 [IU] via SUBCUTANEOUS
  Administered 2021-12-01 – 2021-12-02 (×2): 2 [IU] via SUBCUTANEOUS
  Administered 2021-12-02 – 2021-12-03 (×3): 1 [IU] via SUBCUTANEOUS
  Administered 2021-12-03: 5 [IU] via SUBCUTANEOUS
  Administered 2021-12-03: 2 [IU] via SUBCUTANEOUS
  Filled 2021-11-26 (×19): qty 1

## 2021-11-26 MED ORDER — SODIUM CHLORIDE 0.9 % IV SOLN: INTRAVENOUS | Status: DC

## 2021-11-26 MED ORDER — INSULIN ASPART 100 UNIT/ML IJ SOLN: 0.0000 [IU] | Freq: Every day | INTRAMUSCULAR | Status: DC

## 2021-11-26 NOTE — Assessment & Plan Note (Addendum)
Pressure Injury 11/25/21 Groin Right Deep Tissue Pressure Injury - Purple or maroon localized area of discolored intact skin or blood-filled blister due to damage of underlying soft tissue from pressure and/or shear. (Active)  11/25/21 1702  Location: Groin  Location Orientation: Right  Staging: Deep Tissue Pressure Injury - Purple or maroon localized area of discolored intact skin or blood-filled blister due to damage of underlying soft tissue from pressure and/or shear.  Wound Description (Comments):   Present on Admission: Yes    Noted pressure ulcers on groin, penis and scrotum, stage II.  Present on admission.  Also with right elbow wound abrasions. Appreciate wound care consult.

## 2021-11-26 NOTE — Progress Notes (Signed)
[facility] Progress Note  Patient: Andre Lewis    [id number]  DOA: 11/25/2021    Date of Service: the patient was seen and examined on 11/26/2021  Brief hospital course: 88 year old male with past medical history of diabetes mellitus diet-controlled, senile dementia and PE on Xarelto who was brought in from his skilled nursing facility on 5/17 after falling twice.  Normally patient is ambulatory with minimal assistance from cane and recognizes family members although patient according to his sister-in-law is quite confused and agitated.  Not his baseline.  In the emergency room, noted to have elevated blood pressures with systolic in the [id number].  CT scan of head with severe motion artifact and unable to fully rule out CVA.  Labs otherwise unremarkable other than some acute kidney injury.  Patient brought in for further evaluation and treatment.  Assessment and Plan: Assessment and Plan: * Acute metabolic encephalopathy Etiology is not clear.  No focal neurologic deficit on physical examination.  CT head negative.  Possibly due to delirium in the setting of dementia with history of aggressive behavior. -Placed on telemetry bed for position -Frequent neurochecks -As needed haldol -sitter at bedside  Senile dementia with acute confusional state, with behavioral disturbance ([place]) Unclear etiology.  Does not appear to show signs of infection.  Doubtful this is just from acute kidney injury.  Possible CVA given elevated blood pressures?  We will attempt to repeat CT with anesthesia in the next 24 hours.  Continue Depakote.  Accelerated hypertension Note that patient does not have a history of hypertension and is not on any blood pressure medications.  His blood pressure during this entire hospitalization has ranged from the 150s to 170s.  If this is a CVA, certainly a possibility.  We will also check a BNP and echocardiogram.  Diabetes mellitus without complication (HCC) Recent A1c 7.3.   Higher than would like for diet-controlled diabetes.  Once he is better stabilized, can start metformin as outpatient.  In the meantime, will plan for sliding scale coverage.  Pressure injury of skin Pressure Injury 11/25/21 Groin Right Deep Tissue Pressure Injury - Purple or maroon localized area of discolored intact skin or blood-filled blister due to damage of underlying soft tissue from pressure and/or shear. (Active)  11/25/21 1702  Location: Groin  Location Orientation: Right  Staging: Deep Tissue Pressure Injury - Purple or maroon localized area of discolored intact skin or blood-filled blister due to damage of underlying soft tissue from pressure and/or shear.  Wound Description (Comments):   Present on Admission: Yes    Noted pressure ulcers on groin, stage II.  Present on admission.  Fall -Fall precaution.  Once patient is more stabilized, he will need physical therapy.  Pulmonary embolism (HCC) -Xarelto       Body mass index is 20.55 kg/m.    Pressure Injury 11/25/21 Buttocks Left Stage 3 -  Full thickness tissue loss. Subcutaneous fat may be visible but bone, tendon or muscle are NOT exposed. (Active)  11/25/21 1702  Location: Buttocks  Location Orientation: Left  Staging: Stage 3 -  Full thickness tissue loss. Subcutaneous fat may be visible but bone, tendon or muscle are NOT exposed.  Wound Description (Comments):   Present on Admission: Yes     Pressure Injury 11/25/21 Heel Left Deep Tissue Pressure Injury - Purple or maroon localized area of discolored intact skin or blood-filled blister due to damage of underlying soft tissue from pressure and/or shear. darkened places on foot (Active)  11/25/21 1702  Location: Heel  Location Orientation: Left  Staging: Deep Tissue Pressure Injury - Purple or maroon localized area of discolored intact skin or blood-filled blister due to damage of underlying soft tissue from pressure and/or shear.  Wound Description  (Comments): darkened places on foot  Present on Admission: Yes     Pressure Injury 11/25/21 Groin Right Deep Tissue Pressure Injury - Purple or maroon localized area of discolored intact skin or blood-filled blister due to damage of underlying soft tissue from pressure and/or shear. (Active)  11/25/21 1702  Location: Groin  Location Orientation: Right  Staging: Deep Tissue Pressure Injury - Purple or maroon localized area of discolored intact skin or blood-filled blister due to damage of underlying soft tissue from pressure and/or shear.  Wound Description (Comments):   Present on Admission: Yes  Dressing Type None 11/26/21 0800     Consultants: None  Procedures: Echocardiogram pending  Antimicrobials: None  Code Status: DNR   Subjective: Patient currently quite somnolent  Objective: Noted elevated blood pressures Vitals:   11/26/21 0449 11/26/21 0828  BP: 137/83 (!) 179/69  Pulse: 93 94  Resp: 17 18  Temp: 98 F (36.7 C) 98 F (36.7 C)  SpO2: 95% 95%    Intake/Output Summary (Last 24 hours) at 11/26/2021 1741 Last data filed at 11/26/2021 1330 Gross per 24 hour  Intake 1000 ml  Output 900 ml  Net 100 ml   Filed Weights   11/25/21 0638  Weight: 65 kg   Body mass index is 20.55 kg/m.  Exam:  General: Somnolent HEENT: Normocephalic atraumatic, mucous membranes are dry Cardiovascular: Regular rate and rhythm, S1-S2 Respiratory: Clear auscultation bilaterally Abdomen: Soft, nontender, nondistended, positive bowel sounds Musculoskeletal: No clubbing or cyanosis or edema Skin: No skin breaks, tears or lesions Psychiatry: Underlying dementia acute delirium Neurology: No focal deficits  Data Reviewed: Noted elevated blood sugar in the 170s.  Renal function slightly worsened today with a creatinine of 1.67 and GFR of 39.  BNP mildly elevated at 345.  Procalcitonin is minimal  Disposition:  Status is: Inpatient Remains inpatient appropriate because:  Continued work-up    Anticipated discharge date: 5/22  Remaining issues to be resolved so that patient can be discharged: Improvement in mentation   Family Communication: Sister-in-law at the bedside DVT Prophylaxis:  Rivaroxaban (XARELTO) tablet 15 mg    Author: Annita Brod ,MD 11/26/2021 5:41 PM  To reach On-call, see care teams to locate the attending and reach out via [email or web address]. Between 7PM-7AM, please contact night-coverage If you still have difficulty reaching the attending provider, please page the [facility] (Director on Call) for [facility] on amion for assistance.

## 2021-11-26 NOTE — Assessment & Plan Note (Addendum)
Unclear etiology.  Does not appear to show signs of infection.  Doubtful this is just from acute kidney injury.  Possible CVA given elevated blood pressures?  We will attempt to repeat CT with anesthesia in the next 24 hours.  Continue Depakote.

## 2021-11-26 NOTE — TOC Initial Note (Addendum)
Transition of Care [facility]) - Initial/Assessment Note    Patient Details  Name: Andre Lewis MRN: [id number] Date of Birth: 12/04/1931  Transition of Care [facility]) CM/SW Contact:    Allayne Butcher, RN Phone Number: 11/26/2021, 1:18 PM  Clinical Narrative:                 Patient placed under observation for altered mental status, acute metabolic encephalopathy.  Patient may be ready to discharge today, he is from [facility] Memory Care unit.  RNCM has reached out to patient's sister and left a message for return call and left a message for [facility] to return call about patient returning.    Expected Discharge Plan: Memory Care Barriers to Discharge: Barriers Resolved   Patient Goals and CMS Choice   CMS Medicare.gov Compare Post Acute Care list provided to:: Patient Represenative (must comment) Choice offered to / list presented to : Sibling  Expected Discharge Plan and Services Expected Discharge Plan: Memory Care   Discharge Planning Services: CM Consult   Living arrangements for the past 2 months: Assisted Living Facility                 DME Arranged: N/A DME Agency: NA       HH Arranged: NA HH Agency: NA        Prior Living Arrangements/Services Living arrangements for the past 2 months: Assisted Living Facility Lives with:: Facility Resident Patient language and need for interpreter reviewed:: Yes Do you feel safe going back to the place where you live?: Yes      Need for Family Participation in Patient Care: Yes (Comment) Care giver support system in place?: Yes (comment) (sister)   Criminal Activity/Legal Involvement Pertinent to Current Situation/Hospitalization: No - Comment as needed  Activities of Daily Living Home Assistive Devices/Equipment: Cane (specify quad or straight), Dentures (specify type) ADL Screening (condition at time of admission) Patient's cognitive ability adequate to safely complete daily activities?: No Is the patient deaf or  have difficulty hearing?: No Does the patient have difficulty seeing, even when wearing glasses/contacts?: No Does the patient have difficulty concentrating, remembering, or making decisions?: Yes Patient able to express need for assistance with ADLs?: No Does the patient have difficulty dressing or bathing?: Yes Independently performs ADLs?: No Communication: Dependent Is this a change from baseline?: Change from baseline, expected to last <3 days Dressing (OT): Needs assistance Is this a change from baseline?: Change from baseline, expected to last >3 days Grooming: Dependent Is this a change from baseline?: Change from baseline, expected to last >3 days Feeding: Dependent Bathing: Dependent Is this a change from baseline?: Change from baseline, expected to last >3 days Toileting: Dependent Is this a change from baseline?: Change from baseline, expected to last >3days In/Out Bed: Needs assistance Is this a change from baseline?: Change from baseline, expected to last <3 days Walks in Home: Needs assistance Is this a change from baseline?: Change from baseline, expected to last <3 days Does the patient have difficulty walking or climbing stairs?: Yes Weakness of Legs: Both Weakness of Arms/Hands: None  Permission Sought/Granted Permission sought to share information with : Case Manager, Magazine features editor, Family Supports Permission granted to share information with : Yes, Verbal Permission Granted  Share Information with NAME: Caryl Bis  Permission granted to share info w AGENCY: [employer]  Permission granted to share info w Relationship: sister  Permission granted to share info w Contact Information: [phone]  Emotional Assessment Appearance::  Appears stated age     Orientation: : Fluctuating Orientation (Suspected and/or reported Sundowners) Alcohol / Substance Use: Not Applicable Psych Involvement: No (comment)  Admission diagnosis:  Delirium  [R41.0] Fall, initial encounter [W19.XXXA] Acute metabolic encephalopathy [G93.41] Patient Active Problem List   Diagnosis Date Noted   Acute metabolic encephalopathy 11/25/2021   Fall 11/25/2021   Pulmonary embolism (HCC) 11/25/2021   Diabetes mellitus without complication (HCC) 11/25/2021   Pressure injury of skin 11/25/2021   Alzheimer's dementia (HCC) 08/11/2021   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

## 2021-11-26 NOTE — TOC Progression Note (Signed)
Transition of Care [facility]) - Progression Note    Patient Details  Name: Andre Lewis MRN: [id number] Date of Birth: 23-Oct-1931  [facility]) CM/SW Contact  Shelbie Hutching, RN Phone Number: 11/26/2021, 2:00 PM  Clinical Narrative:    RNCM met with patient's sister in law Andre Lewis at the bedside.  Andre Lewis is the [facility], she brought him down from [place] a couple of years ago to help take care of him.  She just in March sold his home to pay for [facility], she says when that money runs out he will have nothing.  Encouraged Andre Lewis to call the department of social services and talk with a Medicaid worker about applying for LTC Medicaid.    She says that the patient is far from baseline and is normally able to walk with a cane, dress himself, and get to the bathroom.    TOC will follow, patient is currently observation.  Expected Discharge Plan: Memory Care Barriers to Discharge: Barriers Resolved  Expected Discharge Plan and Services Expected Discharge Plan: Memory Care   Discharge Planning Services: CM Consult   Living arrangements for the past 2 months: Assisted Living Facility                 DME Arranged: N/A DME Agency: NA       HH Arranged: NA HH Agency: NA         Social Determinants of Health (SDOH) Interventions    Readmission Risk Interventions     View : No data to display.

## 2021-11-26 NOTE — TOC Progression Note (Signed)
Transition of Care [facility]) - Progression Note    Patient Details  Name: Andre Lewis MRN: [id number] Date of Birth: [birth year]  Transition of Care [facility]) CM/SW Contact  Shelbie Hutching, RN Phone Number: 11/26/2021, 4:17 PM  Clinical Narrative:    Patient has been changed to inpatient and will need a PT evaluation.  Patient may need SNF, sister in law would be agreeable.    Expected Discharge Plan: Memory Care Barriers to Discharge: Barriers Resolved  Expected Discharge Plan and Services Expected Discharge Plan: Memory Care   Discharge Planning Services: CM Consult   Living arrangements for the past 2 months: Assisted Living Facility                 DME Arranged: N/A DME Agency: NA       HH Arranged: NA HH Agency: NA         Social Determinants of Health (SDOH) Interventions    Readmission Risk Interventions     View : No data to display.

## 2021-11-26 NOTE — Assessment & Plan Note (Addendum)
Note that patient does not have a history of hypertension and is not on any blood pressure medications.  His blood pressure during this entire hospitalization has ranged from the 150s to 170s.  This is secondary to acute heart failure.  With 2 doses of IV Lasix, pressures have improved dramatically, almost soft.  Lasix stopped.  Pressures have since started to rise again with systolic in the 150s, likely from IV fluids from IV antibiotics.  I restarted very gentle IV Lasix.

## 2021-11-26 NOTE — Hospital Course (Addendum)
88 year old male with past medical history of diabetes mellitus diet-controlled, senile dementia and PE on Xarelto who was brought in from his skilled nursing facility on 5/17 after falling twice.  Normally patient is ambulatory with minimal assistance from cane and recognizes family members although patient according to his sister-in-law is quite confused and agitated.  Not his baseline.  In the emergency room, noted to have elevated blood pressures with systolic in the 170s.  CT scan of head with severe motion artifact and unable to fully rule out CVA.  Labs otherwise unremarkable other than some acute kidney injury.  Patient brought in for further evaluation and treatment.  Patient noted to have urinary retention and coud catheter placed. BNP on 5/18 elevated at 345 and patient started on some IV Lasix.  He had significant urinary output and pressures have improved.  Repeat urinalysis done 5/19 noted UTI which has grown out Pseudomonas.  Antibiotics adjusted s/op cefepime compelted 5/23.Patient has since been more awake and interactive and appropriate, although p.o. intake still remains minimal. 5/23: Hospice at [facility] 5/24

## 2021-11-27 ENCOUNTER — Inpatient Hospital Stay: Payer: Medicare Other

## 2021-11-27 ENCOUNTER — Inpatient Hospital Stay
Admit: 2021-11-27 | Discharge: 2021-11-27 | Disposition: A | Discharge status: Home / Self Care | Payer: Medicare Other | Attending: Internal Medicine | Admitting: Internal Medicine

## 2021-11-27 DIAGNOSIS — N390 Urinary tract infection, site not specified: Secondary | ICD-10-CM | POA: Diagnosis present

## 2021-11-27 DIAGNOSIS — N179 Acute kidney failure, unspecified: Secondary | ICD-10-CM | POA: Diagnosis not present

## 2021-11-27 DIAGNOSIS — I5031 Acute diastolic (congestive) heart failure: Secondary | ICD-10-CM | POA: Diagnosis not present

## 2021-11-27 DIAGNOSIS — N3000 Acute cystitis without hematuria: Secondary | ICD-10-CM | POA: Diagnosis not present

## 2021-11-27 DIAGNOSIS — F03918 Unspecified dementia, unspecified severity, with other behavioral disturbance: Secondary | ICD-10-CM | POA: Diagnosis not present

## 2021-11-27 LAB — CBC
HCT: 33.5 % — ABNORMAL LOW (ref 39.0–52.0)
Hemoglobin: 11.4 g/dL — ABNORMAL LOW (ref 13.0–17.0)
MCH: 33.9 pg (ref 26.0–34.0)
MCHC: 34 g/dL (ref 30.0–36.0)
MCV: 99.7 fL (ref 80.0–100.0)
Platelets: 134 10*3/uL — ABNORMAL LOW (ref 150–400)
RBC: 3.36 MIL/uL — ABNORMAL LOW (ref 4.22–5.81)
RDW: 12.2 % (ref 11.5–15.5)
WBC: 5.9 10*3/uL (ref 4.0–10.5)
nRBC: 0 % (ref 0.0–0.2)

## 2021-11-27 LAB — ECHOCARDIOGRAM COMPLETE
Height: 70 in
S' Lateral: 2.2 cm
Weight: 2291.2 oz

## 2021-11-27 LAB — BASIC METABOLIC PANEL
Anion gap: 11 (ref 5–15)
BUN: 29 mg/dL — ABNORMAL HIGH (ref 8–23)
CO2: 25 mmol/L (ref 22–32)
Calcium: 9 mg/dL (ref 8.9–10.3)
Chloride: 103 mmol/L (ref 98–111)
Creatinine, Ser: 2 mg/dL — ABNORMAL HIGH (ref 0.61–1.24)
GFR, Estimated: 32 mL/min — ABNORMAL LOW (ref >60)
Glucose, Bld: 157 mg/dL — ABNORMAL HIGH (ref 70–99)
Potassium: 3.7 mmol/L (ref 3.5–5.1)
Sodium: 139 mmol/L (ref 135–145)

## 2021-11-27 LAB — URINALYSIS, ROUTINE W REFLEX MICROSCOPIC
Bilirubin Urine: NEGATIVE
Glucose, UA: NEGATIVE mg/dL
Ketones, ur: NEGATIVE mg/dL
Nitrite: NEGATIVE
Protein, ur: 100 mg/dL — AB
RBC / HPF: >50 RBC/hpf — ABNORMAL HIGH (ref 0–5)
Specific Gravity, Urine: 1.015 (ref 1.005–1.030)
WBC, UA: >50 WBC/hpf — ABNORMAL HIGH (ref 0–5)
pH: 5 (ref 5.0–8.0)

## 2021-11-27 LAB — GLUCOSE, CAPILLARY
Glucose-Capillary: 146 mg/dL — ABNORMAL HIGH (ref 70–99)
Glucose-Capillary: 148 mg/dL — ABNORMAL HIGH (ref 70–99)
Glucose-Capillary: 125 mg/dL — ABNORMAL HIGH (ref 70–99)
Glucose-Capillary: 133 mg/dL — ABNORMAL HIGH (ref 70–99)

## 2021-11-27 LAB — PROCALCITONIN: Procalcitonin: 0.52 ng/mL

## 2021-11-27 LAB — MRSA NEXT GEN BY PCR, NASAL: MRSA by PCR Next Gen: DETECTED — AB

## 2021-11-27 MED ORDER — CHLORHEXIDINE GLUCONATE CLOTH 2 % EX PADS
6.0000 | MEDICATED_PAD | Freq: Every day | CUTANEOUS | Status: AC
  Administered 2021-11-27 – 2021-12-01 (×5): 6 via TOPICAL

## 2021-11-27 MED ORDER — MUPIROCIN 2 % EX OINT
1.0000 "application " | TOPICAL_OINTMENT | Freq: Two times a day (BID) | CUTANEOUS | Status: AC
  Administered 2021-11-27 – 2021-12-01 (×11): 1 via NASAL
  Filled 2021-11-27: qty 22

## 2021-11-27 MED ORDER — SODIUM CHLORIDE 0.9 % IV SOLN
1.0000 g | INTRAVENOUS | Status: DC
Start: 2021-11-27 — End: 2021-11-29
  Administered 2021-11-27 – 2021-11-28 (×2): 1 g via INTRAVENOUS
  Filled 2021-11-27 (×2): qty 1
  Filled 2021-11-27: qty 10

## 2021-11-27 MED ORDER — NAPHAZOLINE-GLYCERIN 0.012-0.25 % OP SOLN
1.0000 [drp] | Freq: Four times a day (QID) | OPHTHALMIC | Status: DC
  Administered 2021-11-27 (×2): 2 [drp] via OPHTHALMIC
  Administered 2021-11-27 – 2021-11-28 (×2): 1 [drp] via OPHTHALMIC
  Administered 2021-11-28: 2 [drp] via OPHTHALMIC
  Administered 2021-11-28 – 2021-11-29 (×3): 1 [drp] via OPHTHALMIC
  Administered 2021-11-29: 2 [drp] via OPHTHALMIC
  Administered 2021-11-29 – 2021-11-30 (×2): 1 [drp] via OPHTHALMIC
  Administered 2021-11-30: 2 [drp] via OPHTHALMIC
  Administered 2021-11-30: 1 [drp] via OPHTHALMIC
  Administered 2021-11-30 – 2021-12-01 (×4): 2 [drp] via OPHTHALMIC
  Administered 2021-12-01 – 2021-12-02 (×2): 1 [drp] via OPHTHALMIC
  Administered 2021-12-02: 2 [drp] via OPHTHALMIC
  Administered 2021-12-02: 1 [drp] via OPHTHALMIC
  Administered 2021-12-03 (×2): 2 [drp] via OPHTHALMIC
  Filled 2021-11-27: qty 15

## 2021-11-27 NOTE — Assessment & Plan Note (Addendum)
Lab Results  Component Value Date   CREATININE 1.10 12/01/2021   CREATININE 0.97 11/30/2021   CREATININE 1.01 11/29/2021  Resolved with hydration.  Likely prerenal

## 2021-11-27 NOTE — Progress Notes (Signed)
Patiens MEWS score was 2. Temp of 100.6 and HR of 106. Patient was sleeping at the time comfortably. Tylenol was given to alleviate the fever. On call MD was notified. Yellow MEWS protocol  was intiated. Will continue to monitor.

## 2021-11-27 NOTE — Progress Notes (Signed)
   11/26/21 2117  Assess: MEWS Score  Temp (!) 100.6 F (38.1 C)  BP 133/89  Pulse Rate (!) 108  Resp 18  SpO2 95 %  O2 Device Room Air  Assess: MEWS Score  MEWS Temp 1  MEWS Systolic 0  MEWS Pulse 1  MEWS RR 0  MEWS LOC 0  MEWS Score 2  MEWS Score Color Yellow  Assess: if the MEWS score is Yellow or Red  Were vital signs taken at a resting state? No  Focused Assessment No change from prior assessment  Does the patient meet 2 or more of the SIRS criteria? Yes  Does the patient have a confirmed or suspected source of infection? No  MEWS guidelines implemented *See Row Information* Yes  Treat  MEWS Interventions Administered prn meds/treatments;Escalated (See documentation below)  Pain Scale Faces  Pain Score 0  Faces Pain Scale 0  Breathing 0  Negative Vocalization 0  Facial Expression 0  Take Vital Signs  Increase Vital Sign Frequency  Yellow: Q 2hr X 2 then Q 4hr X 2, if remains yellow, continue Q 4hrs  Escalate  MEWS: Escalate Yellow: discuss with charge nurse/RN and consider discussing with provider and RRT  Notify: Charge Nurse/RN  Name of Charge Nurse/RN Notified Phylis, RN  Date Charge Nurse/RN Notified 11/26/21  Time Charge Nurse/RN Notified 2118  Document  Patient Outcome Other (Comment) (WAITING ON REPEAT VITALS)  Progress note created (see row info) Yes (Patiens MEWS score was 2. Temp of 100.6 and HR of 106. Patient was sleeping at the time comfortably. Tylenol was given to alleviate the fever. Yellow MEWS protocol  was intiated. Will continue to monitor.)  Assess: SIRS CRITERIA  SIRS Temperature  0  SIRS Pulse 1  SIRS Respirations  0  SIRS WBC 0  SIRS Score Sum  1   Inserted for Gabriel Carina RN

## 2021-11-27 NOTE — Assessment & Plan Note (Addendum)
Well compensated at this time.  Echocardiogram noted grade 1 diastolic dysfunction.

## 2021-11-27 NOTE — Assessment & Plan Note (Addendum)
Treated with antibiotics

## 2021-11-27 NOTE — Progress Notes (Signed)
[facility] Progress Note  Patient: Andre Lewis    [id number]  DOA: 11/25/2021    Date of Service: the patient was seen and examined on 11/27/2021  Brief hospital course: 88 year old male with past medical history of diabetes mellitus diet-controlled, senile dementia and PE on Xarelto who was brought in from his skilled nursing facility on 5/17 after falling twice.  Normally patient is ambulatory with minimal assistance from cane and recognizes family members although patient according to his sister-in-law is quite confused and agitated.  Not his baseline.  In the emergency room, noted to have elevated blood pressures with systolic in the [id number].  CT scan of head with severe motion artifact and unable to fully rule out CVA.  Labs otherwise unremarkable other than some acute kidney injury.  Patient brought in for further evaluation and treatment.  Patient noted to have urinary retention and coud catheter placed.  BNP on 5/18 elevated at 345 and patient started on some IV Lasix.  He has significant urinary output and pressures have improved significantly.  Procalcitonin also trending back up and low-grade temperatures and repeat urinalysis done on 5/19 no strong UTI.  Cultures are pending and patient started on IV Rocephin.  Assessment and Plan: Assessment and Plan: * Senile dementia, delirium, with behavioral disturbance ([place]) Secondary to UTI.  Patient with low-grade temps and urinalysis finally able to be sent this morning.  Resulting in large urinary tract infection.   With this as well as improvement in blood pressure, should greatly help his acute delirium.  UTI (urinary tract infection) As mentioned above.  Cultures pending.  He was noted to have urinary retention on admission and with coud catheter, large amount of urine released.  Small sample initially sent, but this was deceptively normal.  Have started IV Rocephin.   Acute diastolic (congestive) heart failure (HCC) No previous  history of heart failure.  Patient's blood pressures have been elevated since admission and with IV Lasix, had significant output diuresing over 2.4 L and is -1.2 L deficient.  Creatinine trending upward so have stopped Lasix.  AKI (acute kidney injury) ([place]) Initial creatinine normal, however by 5/18, had increased to 1.67.  Was getting IV fluids until concerns for CHF, so fluids stopped and given Lasix which she has diuresed well.  Creatinine up to 2 looks like he is fully diuresed.  For now, would favor no Lasix or fluids and see what his creatinine does in the morning.  Accelerated hypertension Note that patient does not have a history of hypertension and is not on any blood pressure medications.  His blood pressure during this entire hospitalization has ranged from the 150s to 170s.  This is secondary to acute heart failure.  With 2 doses of IV Lasix, pressures have improved dramatically.  Would favor perhaps discharging him on a very low-dose diuretic.  Have stopped Lasix for now.  Diabetes mellitus without complication (HCC) Recent A1c 7.3.  Higher than would like for diet-controlled diabetes.  Once he is better stabilized, can start metformin as outpatient.  In the meantime, will plan for sliding scale coverage.  Pressure injury of skin Pressure Injury 11/25/21 Groin Right Deep Tissue Pressure Injury - Purple or maroon localized area of discolored intact skin or blood-filled blister due to damage of underlying soft tissue from pressure and/or shear. (Active)  11/25/21 1702  Location: Groin  Location Orientation: Right  Staging: Deep Tissue Pressure Injury - Purple or maroon localized area of discolored intact skin or blood-filled blister due  to damage of underlying soft tissue from pressure and/or shear.  Wound Description (Comments):   Present on Admission: Yes    Noted pressure ulcers on groin, stage II.  Present on admission.  Fall -Fall precaution.  Once patient is more  stabilized, he will need physical therapy.  Pulmonary embolism (HCC) -Xarelto       Body mass index is 20.55 kg/m.    Pressure Injury 11/25/21 Buttocks Left Stage 3 -  Full thickness tissue loss. Subcutaneous fat may be visible but bone, tendon or muscle are NOT exposed. (Active)  11/25/21 1702  Location: Buttocks  Location Orientation: Left  Staging: Stage 3 -  Full thickness tissue loss. Subcutaneous fat may be visible but bone, tendon or muscle are NOT exposed.  Wound Description (Comments):   Present on Admission: Yes     Pressure Injury 11/25/21 Heel Left Deep Tissue Pressure Injury - Purple or maroon localized area of discolored intact skin or blood-filled blister due to damage of underlying soft tissue from pressure and/or shear. darkened places on foot (Active)  11/25/21 1702  Location: Heel  Location Orientation: Left  Staging: Deep Tissue Pressure Injury - Purple or maroon localized area of discolored intact skin or blood-filled blister due to damage of underlying soft tissue from pressure and/or shear.  Wound Description (Comments): darkened places on foot  Present on Admission: Yes     Pressure Injury 11/25/21 Groin Right Deep Tissue Pressure Injury - Purple or maroon localized area of discolored intact skin or blood-filled blister due to damage of underlying soft tissue from pressure and/or shear. (Active)  11/25/21 1702  Location: Groin  Location Orientation: Right  Staging: Deep Tissue Pressure Injury - Purple or maroon localized area of discolored intact skin or blood-filled blister due to damage of underlying soft tissue from pressure and/or shear.  Wound Description (Comments):   Present on Admission: Yes  Dressing Type None 11/26/21 2300     Consultants: None  Procedures: Echocardiogram pending  Antimicrobials: IV Rocephin 5/19-present  Code Status: DNR   Subjective: Patient a little more awake, still minimally interactive  Objective: Noted  much improved blood pressures.  Also low-grade fever Vitals:   11/27/21 0507 11/27/21 0739  BP: 136/70 123/63  Pulse: 85 78  Resp: 18 16  Temp: 97.9 F (36.6 C) 98.2 F (36.8 C)  SpO2: 96% 94%    Intake/Output Summary (Last 24 hours) at 11/27/2021 1414 Last data filed at 11/27/2021 0745 Gross per 24 hour  Intake 133.03 ml  Output 1525 ml  Net -1391.97 ml    Filed Weights   11/25/21 0638  Weight: 65 kg   Body mass index is 20.55 kg/m.  Exam:  General: Awake, oriented x1 HEENT: Normocephalic atraumatic, mucous membranes are dry Cardiovascular: Regular rate and rhythm, S1-S2 Respiratory: Clear auscultation bilaterally Abdomen: Soft, nontender, nondistended, positive bowel sounds Musculoskeletal: No clubbing or cyanosis or edema Skin: No skin breaks, tears or lesions Psychiatry: Underlying dementia acute delirium Neurology: No focal deficits  Data Reviewed: Noted worsening creatinine, mildly increasing procalcitonin.  Positive UTI Disposition:  Status is: Inpatient Remains inpatient appropriate because: Treatment of urinary tract infection, improvement in renal function    Anticipated discharge date: 5/22  Remaining issues to be resolved so that patient can be discharged: Improvement in mentation   Family Communication: Sister-in-law at the bedside DVT Prophylaxis:  Rivaroxaban (XARELTO) tablet 15 mg    Author: Annita Brod ,MD 11/27/2021 2:14 PM  To reach On-call, see care teams to locate  the attending and reach out via [email or web address]. Between 7PM-7AM, please contact night-coverage If you still have difficulty reaching the attending provider, please page the [facility] (Director on Call) for [facility] on amion for assistance.

## 2021-11-27 NOTE — Progress Notes (Signed)
*  PRELIMINARY RESULTS* Echocardiogram 2D Echocardiogram has been performed.  Andre Lewis 11/27/2021, 11:02 AM

## 2021-11-28 DIAGNOSIS — N3000 Acute cystitis without hematuria: Secondary | ICD-10-CM | POA: Diagnosis not present

## 2021-11-28 DIAGNOSIS — F03918 Unspecified dementia, unspecified severity, with other behavioral disturbance: Secondary | ICD-10-CM | POA: Diagnosis not present

## 2021-11-28 DIAGNOSIS — I5031 Acute diastolic (congestive) heart failure: Secondary | ICD-10-CM | POA: Diagnosis not present

## 2021-11-28 DIAGNOSIS — N179 Acute kidney failure, unspecified: Secondary | ICD-10-CM | POA: Diagnosis not present

## 2021-11-28 LAB — CBC
HCT: 32.9 % — ABNORMAL LOW (ref 39.0–52.0)
Hemoglobin: 11.1 g/dL — ABNORMAL LOW (ref 13.0–17.0)
MCH: 33.9 pg (ref 26.0–34.0)
MCHC: 33.7 g/dL (ref 30.0–36.0)
MCV: 100.6 fL — ABNORMAL HIGH (ref 80.0–100.0)
Platelets: 130 10*3/uL — ABNORMAL LOW (ref 150–400)
RBC: 3.27 MIL/uL — ABNORMAL LOW (ref 4.22–5.81)
RDW: 12.1 % (ref 11.5–15.5)
WBC: 4.7 10*3/uL (ref 4.0–10.5)
nRBC: 0 % (ref 0.0–0.2)

## 2021-11-28 LAB — GLUCOSE, CAPILLARY
Glucose-Capillary: 138 mg/dL — ABNORMAL HIGH (ref 70–99)
Glucose-Capillary: 136 mg/dL — ABNORMAL HIGH (ref 70–99)
Glucose-Capillary: 167 mg/dL — ABNORMAL HIGH (ref 70–99)
Glucose-Capillary: 142 mg/dL — ABNORMAL HIGH (ref 70–99)

## 2021-11-28 LAB — BASIC METABOLIC PANEL
Anion gap: 8 (ref 5–15)
BUN: 32 mg/dL — ABNORMAL HIGH (ref 8–23)
CO2: 26 mmol/L (ref 22–32)
Calcium: 9.2 mg/dL (ref 8.9–10.3)
Chloride: 106 mmol/L (ref 98–111)
Creatinine, Ser: 1.24 mg/dL (ref 0.61–1.24)
GFR, Estimated: 56 mL/min — ABNORMAL LOW (ref >60)
Glucose, Bld: 156 mg/dL — ABNORMAL HIGH (ref 70–99)
Potassium: 3.8 mmol/L (ref 3.5–5.1)
Sodium: 140 mmol/L (ref 135–145)

## 2021-11-28 LAB — PROCALCITONIN: Procalcitonin: 0.23 ng/mL

## 2021-11-28 MED ORDER — MAGIC MOUTHWASH: 5.0000 mL | Freq: Four times a day (QID) | ORAL | Status: DC | PRN

## 2021-11-28 NOTE — Progress Notes (Signed)
[facility] Progress Note  Patient: Andre Lewis    DHR:[id number]  DOA: 11/25/2021    Date of Service: the patient was seen and examined on 11/28/2021  Brief hospital course: 88 year old male with past medical history of diabetes mellitus diet-controlled, senile dementia and PE on Xarelto who was brought in from his skilled nursing facility on 5/17 after falling twice.  Normally patient is ambulatory with minimal assistance from cane and recognizes family members although patient according to his sister-in-law is quite confused and agitated.  Not his baseline.  In the emergency room, noted to have elevated blood pressures with systolic in the 170s.  CT scan of head with severe motion artifact and unable to fully rule out CVA.  Labs otherwise unremarkable other than some acute kidney injury.  Patient brought in for further evaluation and treatment.  Patient noted to have urinary retention and coud catheter placed.  BNP on 5/18 elevated at 345 and patient started on some IV Lasix.  He has significant urinary output and pressures have improved significantly.  Procalcitonin also trending back up and low-grade temperatures and repeat urinalysis done on 5/19 no strong UTI.  Cultures are pending and patient started on IV Rocephin.  Assessment and Plan: Assessment and Plan: * Senile dementia, delirium, with behavioral disturbance (HCC) Secondary to UTI.  Patient with low-grade temps and urinalysis finally able to be sent this morning.  Resulting in large urinary tract infection.   With this as well as improvement in blood pressure, should greatly help his acute delirium.  UTI (urinary tract infection) As mentioned above.  Cultures growing 100,000 colonies of gram-negative rods, with further sensitivities pending.  He was noted to have urinary retention on admission and with coud catheter, large amount of urine released.  Responding to IV Rocephin with no further fever and procalcitonin level  decreasing  Acute diastolic (congestive) heart failure (HCC) No previous history of heart failure.  Patient's blood pressures have been elevated since admission and with IV Lasix, had significant output diuresing over 2.4 L and is -1.2 L deficient.  Creatinine trending upward so have stopped Lasix.  Echocardiogram notes grade 1 diastolic dysfunction  AKI (acute kidney injury) (HCC) Initial creatinine normal, however by 5/18, had increased to 1.67.  Was getting IV fluids until concerns for CHF, so fluids stopped and given Lasix which she has diuresed well.  Stopped creatinine on 5/19.  Creatinine down to 1.24 today.  Accelerated hypertension Note that patient does not have a history of hypertension and is not on any blood pressure medications.  His blood pressure during this entire hospitalization has ranged from the 150s to 170s.  This is secondary to acute heart failure.  With 2 doses of IV Lasix, pressures have improved dramatically.  Would favor perhaps discharging him on a very low-dose diuretic.  Have stopped Lasix for now.  Diabetes mellitus without complication (HCC) Recent A1c 7.3.  Higher than would like for diet-controlled diabetes.  Once he is better stabilized, can start metformin as outpatient.  In the meantime, will plan for sliding scale coverage.  Pressure injury of skin Pressure Injury 11/25/21 Groin Right Deep Tissue Pressure Injury - Purple or maroon localized area of discolored intact skin or blood-filled blister due to damage of underlying soft tissue from pressure and/or shear. (Active)  11/25/21 1702  Location: Groin  Location Orientation: Right  Staging: Deep Tissue Pressure Injury - Purple or maroon localized area of discolored intact skin or blood-filled blister due to damage of underlying soft  tissue from pressure and/or shear.  Wound Description (Comments):   Present on Admission: Yes    Noted pressure ulcers on groin, stage II.  Present on  admission.  Fall -Fall precaution.  Once patient is more stabilized, he will need physical therapy.  Pulmonary embolism (HCC) -Xarelto       Body mass index is 20.55 kg/m.    Pressure Injury 11/25/21 Buttocks Left Stage 3 -  Full thickness tissue loss. Subcutaneous fat may be visible but bone, tendon or muscle are NOT exposed. (Active)  11/25/21 1702  Location: Buttocks  Location Orientation: Left  Staging: Stage 3 -  Full thickness tissue loss. Subcutaneous fat may be visible but bone, tendon or muscle are NOT exposed.  Wound Description (Comments):   Present on Admission: Yes     Pressure Injury 11/25/21 Heel Left Deep Tissue Pressure Injury - Purple or maroon localized area of discolored intact skin or blood-filled blister due to damage of underlying soft tissue from pressure and/or shear. darkened places on foot (Active)  11/25/21 1702  Location: Heel  Location Orientation: Left  Staging: Deep Tissue Pressure Injury - Purple or maroon localized area of discolored intact skin or blood-filled blister due to damage of underlying soft tissue from pressure and/or shear.  Wound Description (Comments): darkened places on foot  Present on Admission: Yes     Pressure Injury 11/25/21 Groin Right Deep Tissue Pressure Injury - Purple or maroon localized area of discolored intact skin or blood-filled blister due to damage of underlying soft tissue from pressure and/or shear. (Active)  11/25/21 1702  Location: Groin  Location Orientation: Right  Staging: Deep Tissue Pressure Injury - Purple or maroon localized area of discolored intact skin or blood-filled blister due to damage of underlying soft tissue from pressure and/or shear.  Wound Description (Comments):   Present on Admission: Yes  Dressing Type None 11/27/21 1923     Consultants: None  Procedures: Echocardiogram done 5/19 noting grade 1 diastolic dysfunction  Antimicrobials: IV Rocephin 5/19-present  Code Status:  DNR   Subjective: Heavily sleeping  Objective: Afebrile x24 hours Vitals:   11/27/21 2126 11/28/21 0831  BP: 126/60 140/71  Pulse: 82 73  Resp: 20 14  Temp: 98.8 F (37.1 C) 98.1 F (36.7 C)  SpO2: 95% 95%    Intake/Output Summary (Last 24 hours) at 11/28/2021 1318 Last data filed at 11/28/2021 0500 Gross per 24 hour  Intake 89.33 ml  Output 500 ml  Net -410.67 ml    Filed Weights   11/25/21 0638  Weight: 65 kg   Body mass index is 20.55 kg/m.  Exam:  General: Resting comfortably HEENT: Normocephalic atraumatic, mucous membranes are dry Cardiovascular: Regular rate and rhythm, S1-S2 Respiratory: Clear auscultation bilaterally Abdomen: Soft, nontender, nondistended, positive bowel sounds Musculoskeletal: No clubbing or cyanosis or edema Skin: No skin breaks, tears or lesions Psychiatry: Underlying dementia acute delirium Neurology: No focal deficits  Data Reviewed: Creatinine improved to 1.24.  Improving procalcitonin. Disposition:  Status is: Inpatient Remains inpatient appropriate because: Improvement in mentation, renal function    Anticipated discharge date: 5/22   Family Communication: Left message for sister-in-law DVT Prophylaxis:  Rivaroxaban (XARELTO) tablet 15 mg    Author: Hollice Espy ,MD 11/28/2021 1:18 PM  To reach On-call, see care teams to locate the attending and reach out via [email or web address]. Between 7PM-7AM, please contact night-coverage If you still have difficulty reaching the attending provider, please page the [facility] (Director on Call) for [facility] on [employer]  for assistance.

## 2021-11-28 NOTE — Progress Notes (Signed)
He was 2 orders for a sitter that expired.  He did not have a sitter last night or today.  He appears calm, can I d/c the order?  Checked with MD, order d/c'd.   MD also notified of purulent drainage from skin tears on R arm and severe excoriation on his penis and scrotum. Wound care consult ordered.

## 2021-11-29 DIAGNOSIS — F03918 Unspecified dementia, unspecified severity, with other behavioral disturbance: Secondary | ICD-10-CM | POA: Diagnosis not present

## 2021-11-29 DIAGNOSIS — I5031 Acute diastolic (congestive) heart failure: Secondary | ICD-10-CM | POA: Diagnosis not present

## 2021-11-29 DIAGNOSIS — N179 Acute kidney failure, unspecified: Secondary | ICD-10-CM | POA: Diagnosis not present

## 2021-11-29 DIAGNOSIS — N3000 Acute cystitis without hematuria: Secondary | ICD-10-CM | POA: Diagnosis not present

## 2021-11-29 LAB — BASIC METABOLIC PANEL
Anion gap: 7 (ref 5–15)
BUN: 27 mg/dL — ABNORMAL HIGH (ref 8–23)
CO2: 26 mmol/L (ref 22–32)
Calcium: 9.1 mg/dL (ref 8.9–10.3)
Chloride: 107 mmol/L (ref 98–111)
Creatinine, Ser: 1.01 mg/dL (ref 0.61–1.24)
GFR, Estimated: >60 mL/min (ref >60)
Glucose, Bld: 157 mg/dL — ABNORMAL HIGH (ref 70–99)
Potassium: 3.7 mmol/L (ref 3.5–5.1)
Sodium: 140 mmol/L (ref 135–145)

## 2021-11-29 LAB — GLUCOSE, CAPILLARY
Glucose-Capillary: 144 mg/dL — ABNORMAL HIGH (ref 70–99)
Glucose-Capillary: 144 mg/dL — ABNORMAL HIGH (ref 70–99)
Glucose-Capillary: 177 mg/dL — ABNORMAL HIGH (ref 70–99)
Glucose-Capillary: 176 mg/dL — ABNORMAL HIGH (ref 70–99)

## 2021-11-29 LAB — URINE CULTURE: Culture: >=100000 — AB

## 2021-11-29 MED ORDER — SODIUM CHLORIDE 0.9 % IV SOLN
INTRAVENOUS | Status: DC | PRN
Start: 2021-11-29 — End: 2021-12-03

## 2021-11-29 MED ORDER — FUROSEMIDE 10 MG/ML IJ SOLN
10.0000 mg | Freq: Every day | INTRAMUSCULAR | Status: DC
  Administered 2021-11-30 – 2021-12-01 (×2): 10 mg via INTRAVENOUS
  Filled 2021-11-29 (×2): qty 2

## 2021-11-29 MED ORDER — SODIUM CHLORIDE 0.9 % IV SOLN
2.0000 g | Freq: Two times a day (BID) | INTRAVENOUS | Status: DC
  Administered 2021-11-29 – 2021-12-01 (×5): 2 g via INTRAVENOUS
  Filled 2021-11-29 (×2): qty 2
  Filled 2021-11-29 (×4): qty 12.5

## 2021-11-29 MED ORDER — FUROSEMIDE 10 MG/ML IJ SOLN
20.0000 mg | Freq: Every day | INTRAMUSCULAR | Status: DC
  Administered 2021-11-29: 20 mg via INTRAVENOUS
  Filled 2021-11-29: qty 2

## 2021-11-29 NOTE — Progress Notes (Signed)
[facility] Progress Note  Patient: Andre Lewis    BWG:[id number]  DOA: 11/25/2021    Date of Service: the patient was seen and examined on 11/29/2021  Brief hospital course: 88 year old male with past medical history of diabetes mellitus diet-controlled, senile dementia and PE on Xarelto who was brought in from his skilled nursing facility on 5/17 after falling twice.  Normally patient is ambulatory with minimal assistance from cane and recognizes family members although patient according to his sister-in-law is quite confused and agitated.  Not his baseline.  In the emergency room, noted to have elevated blood pressures with systolic in the 170s.  CT scan of head with severe motion artifact and unable to fully rule out CVA.  Labs otherwise unremarkable other than some acute kidney injury.  Patient brought in for further evaluation and treatment.  Patient noted to have urinary retention and coud catheter placed.  BNP on 5/18 elevated at 345 and patient started on some IV Lasix.  He had significant urinary output and pressures have improved.  Repeat urinalysis done 5/19 noted UTI which has grown out Pseudomonas.  Antibiotics adjusted.  Patient still remains incredibly somnolent with minimal p.o. intake.  Assessment and Plan: Assessment and Plan: * Senile dementia, delirium, with behavioral disturbance (HCC) Secondary to UTI.  Patient with low-grade temps and urinalysis done 5/19 revealed large urinary tract infection with cultures back on 5/21 for Pseudomonas.  Antibiotics adjusted.  Hopefully with proper antibiotics on board and improvement in urinary tract infection, patient will become more alert and interactive.  If not, need to consider hospice care as he is taking minimal, if any, p.o.  UTI (urinary tract infection) As mentioned above.  He was noted to have urinary retention on admission and with coud catheter, large amount of urine released.  5/19 urinalysis noted UTI and patient  started on IV Rocephin.  Cultures and sensitivities back on 5/21 noting Pseudomonas and antibiotics changed to cefepime.   Acute diastolic (congestive) heart failure (HCC) No previous history of heart failure.  Patient's blood pressures have been elevated since admission and with IV Lasix and he responded with significant diuresis and blood pressures significantly improved.  Creatinine trending upward so Lasix was stopped.  Echocardiogram noted grade 1 diastolic dysfunction.  Lasix restarted on 5/21 as pressures have been trending upward.  Currently he has diuresed over 3.7 L and is -2.2 L deficient.  AKI (acute kidney injury) (HCC) Initial creatinine normal, however by 5/18, had increased to 1.67.  Was getting IV fluids until concerns for CHF, so fluids stopped and given Lasix which she has diuresed well.  Stopped creatinine on 5/19.  Creatinine down to 1.01 today, although I expect this to rise with restarting of Lasix.  Accelerated hypertension Note that patient does not have a history of hypertension and is not on any blood pressure medications.  His blood pressure during this entire hospitalization has ranged from the 150s to 170s.  This is secondary to acute heart failure.  With 2 doses of IV Lasix, pressures have improved dramatically, almost soft.  Lasix stopped.  Pressures have since started to rise again with systolic in the 150s, likely from IV fluids from IV antibiotics.  I restarted very gentle IV Lasix.  Diabetes mellitus without complication (HCC) Recent A1c 7.3.  Higher than would like for diet-controlled diabetes.  Once he is better stabilized, can start metformin as outpatient.  In the meantime, will plan for sliding scale coverage.  Pressure injury of skin Pressure Injury  11/25/21 Groin Right Deep Tissue Pressure Injury - Purple or maroon localized area of discolored intact skin or blood-filled blister due to damage of underlying soft tissue from pressure and/or shear. (Active)   11/25/21 1702  Location: Groin  Location Orientation: Right  Staging: Deep Tissue Pressure Injury - Purple or maroon localized area of discolored intact skin or blood-filled blister due to damage of underlying soft tissue from pressure and/or shear.  Wound Description (Comments):   Present on Admission: Yes    Noted pressure ulcers on groin, penis and scrotum, stage II.  Present on admission.  Also with stage III pressure injury to left heel and buttocks. Wound care consult  Fall -Fall precaution.  Once patient is more stabilized, he will need physical therapy.  Pulmonary embolism (HCC) -Xarelto       Body mass index is 20.55 kg/m.    Pressure Injury 11/25/21 Buttocks Left Stage 3 -  Full thickness tissue loss. Subcutaneous fat may be visible but bone, tendon or muscle are NOT exposed. (Active)  11/25/21 1702  Location: Buttocks  Location Orientation: Left  Staging: Stage 3 -  Full thickness tissue loss. Subcutaneous fat may be visible but bone, tendon or muscle are NOT exposed.  Wound Description (Comments):   Present on Admission: Yes  Dressing Type Foam - Lift dressing to assess site every shift 11/29/21 0809     Consultants: None  Procedures: Echocardiogram done 5/19 noting grade 1 diastolic dysfunction  Antimicrobials: IV Rocephin 5/19-present  Code Status: DNR   Subjective: Still significantly somnolent  Objective: Afebrile x24 hours Vitals:   11/29/21 0549 11/29/21 0811  BP: (!) 164/75 (!) 155/74  Pulse: 69 67  Resp: 20 16  Temp: 97.9 F (36.6 C) 98.2 F (36.8 C)  SpO2: 95% 96%    Intake/Output Summary (Last 24 hours) at 11/29/2021 1353 Last data filed at 11/29/2021 1348 Gross per 24 hour  Intake 350.67 ml  Output 1625 ml  Net -1274.33 ml   Filed Weights   11/25/21 0638  Weight: 65 kg   Body mass index is 20.55 kg/m.  Exam:  General: Somnolent HEENT: Normocephalic atraumatic, mucous membranes are dry Cardiovascular: Regular rate  and rhythm, S1-S2 Respiratory: Clear auscultation bilaterally Abdomen: Soft, nontender, nondistended, positive bowel sounds Musculoskeletal: No clubbing or cyanosis or edema Skin: As noted above in assessment and plan.  Stage III ulcers to heels and buttocks and stage II ulcers to groin including penis and scrotum. Psychiatry: Underlying dementia acute delirium Neurology: No focal deficits  Data Reviewed: Improving creatinine.  Urine cultures positive for Pseudomonas.  Disposition:  Status is: Inpatient Remains inpatient appropriate because: Hoping to see improvement in mentation    Anticipated discharge date: 5/23   Family Communication: Left message for sister-in-law DVT Prophylaxis:  Rivaroxaban (XARELTO) tablet 15 mg    Author: Hollice Espy ,MD 11/29/2021 1:53 PM  To reach On-call, see care teams to locate the attending and reach out via [email or web address]. Between 7PM-7AM, please contact night-coverage If you still have difficulty reaching the attending provider, please page the [facility] (Director on Call) for [facility] on amion for assistance.

## 2021-11-30 DIAGNOSIS — F03918 Unspecified dementia, unspecified severity, with other behavioral disturbance: Secondary | ICD-10-CM | POA: Diagnosis not present

## 2021-11-30 DIAGNOSIS — N3000 Acute cystitis without hematuria: Secondary | ICD-10-CM | POA: Diagnosis not present

## 2021-11-30 DIAGNOSIS — N179 Acute kidney failure, unspecified: Secondary | ICD-10-CM | POA: Diagnosis not present

## 2021-11-30 DIAGNOSIS — I5031 Acute diastolic (congestive) heart failure: Secondary | ICD-10-CM | POA: Diagnosis not present

## 2021-11-30 LAB — BASIC METABOLIC PANEL
Anion gap: 6 (ref 5–15)
BUN: 27 mg/dL — ABNORMAL HIGH (ref 8–23)
CO2: 29 mmol/L (ref 22–32)
Calcium: 9.1 mg/dL (ref 8.9–10.3)
Chloride: 103 mmol/L (ref 98–111)
Creatinine, Ser: 0.97 mg/dL (ref 0.61–1.24)
GFR, Estimated: >60 mL/min (ref >60)
Glucose, Bld: 154 mg/dL — ABNORMAL HIGH (ref 70–99)
Potassium: 3.3 mmol/L — ABNORMAL LOW (ref 3.5–5.1)
Sodium: 138 mmol/L (ref 135–145)

## 2021-11-30 LAB — GLUCOSE, CAPILLARY
Glucose-Capillary: 164 mg/dL — ABNORMAL HIGH (ref 70–99)
Glucose-Capillary: 135 mg/dL — ABNORMAL HIGH (ref 70–99)
Glucose-Capillary: 177 mg/dL — ABNORMAL HIGH (ref 70–99)
Glucose-Capillary: 143 mg/dL — ABNORMAL HIGH (ref 70–99)

## 2021-11-30 LAB — PROCALCITONIN: Procalcitonin: <0.1 ng/mL

## 2021-11-30 NOTE — Consult Note (Addendum)
[facility] Nurse Consult Note: Patient receiving care in [facility]. Patient able to turn self to right side with slight assistance. Reason for Consult: RUE and right elbow wounds Wound type: likely areas represent abrasions Pressure Injury POA: Yes/No/NA Measurement:na Wound bed: small amount of yellow drainage over wound beds. No s/s of infection Drainage (amount, consistency, odor) no odor Periwound: fragile, but intact Dressing procedure/placement/frequency: Remove dressings from the RUE bicep area, and the right elbow. Cleanse areas with NS. Pat dry. Place a SMALL foam dressing over each area.  Cleanse areas daily. Change foam dressings every 3 days and prn.  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 Using the following instructions, wrap the testicles and penis areas with InterDry. Measure and cut length of InterDry to fit around the scrotum and penis  Tuck InterDry fabric into the area in a single layer, allow for 2 inches of overhang from skin edges to allow for wicking to occur May remove to bathe; dry area thoroughly and then tuck into affected areas again  Do not apply any creams or ointments when using InterDry DO NOT THROW AWAY FOR 5 DAYS unless soiled with stool DO NOT [facility] product, this will inactivate the silver in the material  New sheet of Interdry should be applied after 5 days of use if patient continues to have skin breakdown Use [facility]mer[facility] [phone] to order the InterDry.  I did not find wounds on the sacrum of either heel. Monitor the wound area(s) for worsening of condition such as: Signs/symptoms of infection,  Increase in size,  Development of or worsening of odor, Development of pain, or increased pain at the affected locations.  Notify the medical team if any of these develop.  Thank you for the consult. [facility] nurse will not follow at this time.  Please re-consult the [facility] team if needed.  Val Riles, RN, MSN, CWOCN, CNS-BC, pager [phone]

## 2021-11-30 NOTE — Progress Notes (Signed)
[facility] Progress Note  Patient: Andre Lewis    [id number]  DOA: 11/25/2021    Date of Service: the patient was seen and examined on 11/30/2021  Brief hospital course: 88 year old male with past medical history of diabetes mellitus diet-controlled, senile dementia and PE on Xarelto who was brought in from his skilled nursing facility on 5/17 after falling twice.  Normally patient is ambulatory with minimal assistance from cane and recognizes family members although patient according to his sister-in-law is quite confused and agitated.  Not his baseline.  In the emergency room, noted to have elevated blood pressures with systolic in the [id number].  CT scan of head with severe motion artifact and unable to fully rule out CVA.  Labs otherwise unremarkable other than some acute kidney injury.  Patient brought in for further evaluation and treatment.  Patient noted to have urinary retention and coud catheter placed.  BNP on 5/18 elevated at 345 and patient started on some IV Lasix.  He had significant urinary output and pressures have improved.  Repeat urinalysis done 5/19 noted UTI which has grown out Pseudomonas.  Antibiotics adjusted.  Patient has since been more awake and interactive and appropriate, although p.o. intake still remains minimal.  Assessment and Plan: Assessment and Plan: * Senile dementia, delirium, with behavioral disturbance ([place]) Secondary to UTI.  Patient with low-grade temps and urinalysis done 5/19 revealed large urinary tract infection with cultures back on 5/21 for Pseudomonas.  Antibiotics adjusted.  Hopefully with proper antibiotics on board and improvement in urinary tract infection, patient's mentation seems to have improved.  However, his p.o. intake is still very minimal.  Had discussion with patient's sister-in-law and she agrees that if he does not continue to improve and start taking in p.o., need to consider other options such as comfort care/hospice.  UTI  (urinary tract infection) As mentioned above.  He was noted to have urinary retention on admission and with coud catheter, large amount of urine released.  5/19 urinalysis noted UTI and patient started on IV Rocephin.  Cultures and sensitivities back on 5/21 noting Pseudomonas and antibiotics changed to cefepime.   Acute diastolic (congestive) heart failure (HCC) No previous history of heart failure.  Patient's blood pressures have been elevated since admission and with IV Lasix and he responded with significant diuresis and blood pressures significantly improved.  Creatinine trending upward so Lasix was stopped.  Echocardiogram noted grade 1 diastolic dysfunction.  Lasix restarted on 5/21 as pressures have been trending upward.  Currently he has diuresed over 5.7 L and is -3.5 L deficient.  AKI (acute kidney injury) ([place]) Initial creatinine normal, however by 5/18, had increased to 1.67, possibly from urinary retention.  Was getting IV fluids until concerns for CHF, so fluids stopped and given Lasix which, he has diuresed well.  Stopped Lasix on 5/19 and creatinine has since improved and although Lasix was restarted on 5/21, creatinine currently at 0.97, the best it has been.  Accelerated hypertension Note that patient does not have a history of hypertension and is not on any blood pressure medications.  His blood pressure during this entire hospitalization has ranged from the 150s to 170s.  This is secondary to acute heart failure.  With 2 doses of IV Lasix, pressures have improved dramatically, almost soft.  Lasix stopped.  Pressures have since started to rise again with systolic in the [id number], likely from IV fluids from IV antibiotics.  I restarted very gentle IV Lasix.  Diabetes mellitus without complication ([place])  Recent A1c 7.3.  Higher than would like for diet-controlled diabetes.  Once he is better stabilized, can start metformin as outpatient.  In the meantime, will plan for sliding scale  coverage.  Pressure injury of skin Pressure Injury 11/25/21 Groin Right Deep Tissue Pressure Injury - Purple or maroon localized area of discolored intact skin or blood-filled blister due to damage of underlying soft tissue from pressure and/or shear. (Active)  11/25/21 1702  Location: Groin  Location Orientation: Right  Staging: Deep Tissue Pressure Injury - Purple or maroon localized area of discolored intact skin or blood-filled blister due to damage of underlying soft tissue from pressure and/or shear.  Wound Description (Comments):   Present on Admission: Yes    Noted pressure ulcers on groin, penis and scrotum, stage II.  Present on admission.  Also with right elbow wound abrasions. Appreciate wound care consult.  Fall -Fall precaution.  Once patient is more stabilized, he will need physical therapy.  Pulmonary embolism (HCC) -Xarelto       Body mass index is 20.55 kg/m.    Pressure Injury 11/25/21 Buttocks Left Stage 3 -  Full thickness tissue loss. Subcutaneous fat may be visible but bone, tendon or muscle are NOT exposed. (Active)  11/25/21 1702  Location: Buttocks  Location Orientation: Left  Staging: Stage 3 -  Full thickness tissue loss. Subcutaneous fat may be visible but bone, tendon or muscle are NOT exposed.  Wound Description (Comments):   Present on Admission: Yes  Dressing Type Foam - Lift dressing to assess site every shift 11/30/21 0900     Consultants: None  Procedures: Echocardiogram done 5/19 noting grade 1 diastolic dysfunction  Antimicrobials: IV Rocephin 5/19-present  Code Status: DNR   Subjective: Awake, no acute distress.  Objective: Afebrile x24 hours Vitals:   11/30/21 0501 11/30/21 0832  BP: 132/81 (!) 147/70  Pulse: 65 75  Resp: 20 18  Temp: 98.5 F (36.9 C) 98 F (36.7 C)  SpO2: 98% 97%    Intake/Output Summary (Last 24 hours) at 11/30/2021 1311 Last data filed at 11/30/2021 1000 Gross per 24 hour  Intake 658.99 ml   Output 2000 ml  Net -1341.01 ml    Filed Weights   11/25/21 0638  Weight: 65 kg   Body mass index is 20.55 kg/m.  Exam:  General: Oriented x1, appropriate HEENT: Normocephalic atraumatic, mucous membranes are dry Cardiovascular: Regular rate and rhythm, S1-S2 Respiratory: Clear auscultation bilaterally Abdomen: Soft, nontender, nondistended, positive bowel sounds Musculoskeletal: No clubbing or cyanosis or edema Skin: As noted above in assessment and plan.  stage II ulcers to groin including penis and scrotum. Psychiatry: Underlying dementia, but patient is now more appropriate and interacts appropriately Neurology: No focal deficits  Data Reviewed: Hypokalemia.  Noted improvement in creatinine.  Disposition:  Status is: Inpatient Remains inpatient appropriate because: Hoping to see improvement in mentation    Anticipated discharge date: 5/24   Family Communication: Sister-in-law at the bedside DVT Prophylaxis:  Rivaroxaban (XARELTO) tablet 15 mg    Author: Annita Brod ,MD 11/30/2021 1:11 PM  To reach On-call, see care teams to locate the attending and reach out via [email or web address]. Between 7PM-7AM, please contact night-coverage If you still have difficulty reaching the attending provider, please page the [facility] (Director on Call) for [facility] on amion for assistance.

## 2021-12-01 DIAGNOSIS — Z7189 Other specified counseling: Secondary | ICD-10-CM

## 2021-12-01 DIAGNOSIS — F03918 Unspecified dementia, unspecified severity, with other behavioral disturbance: Secondary | ICD-10-CM | POA: Diagnosis not present

## 2021-12-01 DIAGNOSIS — N179 Acute kidney failure, unspecified: Secondary | ICD-10-CM | POA: Diagnosis not present

## 2021-12-01 DIAGNOSIS — N3000 Acute cystitis without hematuria: Secondary | ICD-10-CM | POA: Diagnosis not present

## 2021-12-01 DIAGNOSIS — I5031 Acute diastolic (congestive) heart failure: Secondary | ICD-10-CM | POA: Diagnosis not present

## 2021-12-01 DIAGNOSIS — R410 Disorientation, unspecified: Secondary | ICD-10-CM | POA: Diagnosis present

## 2021-12-01 LAB — BASIC METABOLIC PANEL
Anion gap: 7 (ref 5–15)
BUN: 30 mg/dL — ABNORMAL HIGH (ref 8–23)
CO2: 29 mmol/L (ref 22–32)
Calcium: 9.1 mg/dL (ref 8.9–10.3)
Chloride: 103 mmol/L (ref 98–111)
Creatinine, Ser: 1.1 mg/dL (ref 0.61–1.24)
GFR, Estimated: >60 mL/min (ref >60)
Glucose, Bld: 137 mg/dL — ABNORMAL HIGH (ref 70–99)
Potassium: 3.8 mmol/L (ref 3.5–5.1)
Sodium: 139 mmol/L (ref 135–145)

## 2021-12-01 LAB — GLUCOSE, CAPILLARY
Glucose-Capillary: 141 mg/dL — ABNORMAL HIGH (ref 70–99)
Glucose-Capillary: 169 mg/dL — ABNORMAL HIGH (ref 70–99)
Glucose-Capillary: 106 mg/dL — ABNORMAL HIGH (ref 70–99)
Glucose-Capillary: 136 mg/dL — ABNORMAL HIGH (ref 70–99)

## 2021-12-01 MED ORDER — CHLORHEXIDINE GLUCONATE CLOTH 2 % EX PADS
6.0000 | MEDICATED_PAD | Freq: Every day | CUTANEOUS | Status: DC
  Administered 2021-12-02: 6 via TOPICAL

## 2021-12-01 NOTE — Evaluation (Addendum)
Clinical/Bedside Swallow Evaluation Patient Details  Name: Andre BromeJulian Lewis MRN: [id number] Date of Birth: 11/24/1931  Today's Date: 12/01/2021 Time: SLP Start Time (ACUTE ONLY): 0845 SLP Stop Time (ACUTE ONLY): 0935 SLP Time Calculation (min) (ACUTE ONLY): 50 min  Past Medical History:  Past Medical History:  Diagnosis Date   Dementia (HCC)    Diabetes mellitus without complication (HCC)    Pulmonary embolism (HCC)    Past Surgical History:  Past Surgical History:  Procedure Laterality Date   HERNIA REPAIR     KIDNEY STONE SURGERY     HPI:  Pt is an 88 year old male with past medical history of diabetes mellitus diet-controlled, senile dementia and PE on Xarelto who was brought in from his skilled nursing facility on 5/17 after falling twice.  Normally patient is ambulatory with minimal assistance from cane and recognizes family members although patient according to his sister-in-law is quite confused and agitated.  In the emergency room, noted to have elevated blood pressures with systolic in the 170s.  CT scan of Head with severe motion artifact and unable to fully rule out CVA.  Labs otherwise unremarkable other than some acute kidney injury.  Patient brought in for further evaluation and treatment.  Patient noted to have urinary retention and coud catheter placed.     BNP on 5/18 elevated at 345 and patient started on some IV Lasix.  He had significant urinary output and pressures have improved.  Repeat urinalysis done 5/19 noted UTI which has grown out Pseudomonas.  Antibiotics adjusted.  Patient has since been more awake and interactive and appropriate, although p.o. intake still remains minimal.  Pt was seen in this ED in 07/2021.  [facility]  CXR: Trace left pleural fluid with mild left base airspace disease.    Assessment / Plan / Recommendation  Clinical Impression   Pt seen this morning for BSE. Pt alerted to verbal/tactile stim; he tended to  close his eyes b/t cues/stim given though stated he was "not sleepy" -- suspect impact from Cognitive decline. He followed basic 1-step commends given cues; spoke few words and was oriented to self only.   Pt appears to present w/ mild oral phase dysphagia in setting of ill-fitting lower Denture plate and declined Cognitive status, Baseline Dementia. Cognitive decline can impact his overall awareness/timing of swallow and safety during po tasks which increases risk for aspiration, choking. Poorly masticated foods increase risk for choking. Pt's risk for aspiration can be reduced when following general aspiration precautions and using a modified diet consistency of broken down foods. He required min-mod verbal/visual cues for follow through during po tasks and self-feeding.        Pt consumed several trials of ice chips, purees, well-cut solids and thin liquids via cup/straw w/ No immediate, overt clinical s/s of aspiration noted: no decline in vocal quality; no cough, and no decline in respiratory status during/post trials. Noted mild throat clearing w/ multiple sips of thin liquids via straw. Oral phase was adequate for bolus management and oral clearing of the liquid and puree boluses given. Mastication of softened solids was less efficient and impacted by the ill-fitting lower Denture plate. Time and moistening the foods (broken small) seemed helpful for effective bolus management of the soft solid foods. Pt attempted self-feeding but required min-mod support and guidance d/t the Cognitive decline and overall weakness. He was able to feed self which improves safety of swallowing. [facility] No unilateral weakness  noted. Some confusion of OM tasks and oral care noted. Hand over hand guidance and visual cues were helpful to initiate po tasks.         In setting of baseline Dementia, Cognitive decline, ill-fitting lower Denture plate, and mild risk for aspiration, recommend initiation of the  dysphagia level 3(more chopped meats) moistened for ease of oral phase/mastication) w/ thin liquids VIA CUP; general aspiration precautions; reduce Distractions during meals and engage pt during meals for self-feeding. Pills Crushed in Puree for safer swallowing as needed. Support w/ feeding at meals as needed. MD/NSG updated.  ST services recommends follow w/ Palliative Care for GOC and education re: impact of Cognitive decline/Dementia on oral intake, swallowing. Suspect pt is close to/at his baseline in setting of Cognitive decline. Precautions posted in room. SLP Visit Diagnosis: Dysphagia, oral phase (R13.11) (ill-fitting lower denture plate impacting mastication of solids)    Aspiration Risk  Mild aspiration risk;Risk for inadequate nutrition/hydration (reduced following aspiration precautions and securing lower Denture plate as best able)    Diet Recommendation   dysphagia level 3(more chopped meats) moistened for ease of oral phase/mastication) w/ thin liquids VIA CUP; general aspiration precautions; reduce Distractions during meals and engage pt during meals for self-feeding. Support w/ feeding at meals as needed.  Medication Administration: Whole meds with puree (vs Crushed in Puree for safer swallowing d/t Cognitive decline/Dementia)    Other  Recommendations Recommended Consults:  (Dietician f/u) Oral Care Recommendations: Oral care BID;Oral care before and after PO;Staff/trained caregiver to provide oral care (denture care) Other Recommendations:  (n/a)    Recommendations for follow up therapy are one component of a multi-disciplinary discharge planning process, led by the attending physician.  Recommendations may be updated based on patient status, additional functional criteria and insurance authorization.  Follow up Recommendations No SLP follow up -- recommend f/u at his SNF if any decline from his Baseline there. Supervision and support at meals.      Assistance Recommended at  Discharge Intermittent Supervision/Assistance (unsure of his baseline - recommend Monitoring at Meals for Support)  Functional Status Assessment Patient has had a recent decline in their functional status and/or demonstrates limited ability to make significant improvements in function in a reasonable and predictable amount of time (suspect impact from Baseline Dementia)  Frequency and Duration  (n/a)   (n/a)       Prognosis Prognosis for Safe Diet Advancement: Fair (-Good) Barriers to Reach Goals: Cognitive deficits;Language deficits;Time post onset;Severity of deficits;Behavior Barriers/Prognosis Comment: ill-fitting lower denture plate impacting mastication; Baseline Dementia      Swallow Study   General Date of Onset: 11/25/21 HPI: Pt is an 88 year old male with past medical history of diabetes mellitus diet-controlled, senile dementia and PE on Xarelto who was brought in from his skilled nursing facility on 5/17 after falling twice.  Normally patient is ambulatory with minimal assistance from cane and recognizes family members although patient according to his sister-in-law is quite confused and agitated.  In the emergency room, noted to have elevated blood pressures with systolic in the 170s.  CT scan of Head with severe motion artifact and unable to fully rule out CVA.  Labs otherwise unremarkable other than some acute kidney injury.  Patient brought in for further evaluation and treatment.  Patient noted to have urinary retention and coud catheter placed.     BNP on 5/18 elevated at 345 and patient started on some IV Lasix.  He had significant urinary output and pressures have improved.  Repeat urinalysis done 5/19 noted UTI which has grown out Pseudomonas.  Antibiotics adjusted.  Patient has since been more awake and interactive and appropriate, although p.o. intake still remains minimal.  Pt was seen in this ED in 07/2021.  He resides at [facility] memory care facility.  CXR: Trace left  pleural fluid with mild left base airspace disease. Type of Study: Bedside Swallow Evaluation Previous Swallow Assessment: none Diet Prior to this Study: Regular;Thin liquids Temperature Spikes Noted: No (wbc 4.7) Respiratory Status: Room air History of Recent Intubation: No Behavior/Cognition: Alert;Cooperative;Pleasant mood;Confused;Requires cueing;Distractible (eyes closed intermittent -- suspect impact from Dementia) Oral Cavity Assessment: Within Functional Limits Oral Care Completed by SLP: Yes Oral Cavity - Dentition: Dentures, top;Dentures, bottom (ill-fitting lower denture plate) Vision: Functional for self-feeding Self-Feeding Abilities: Able to feed self;Needs assist;Needs set up;Total assist (much support) Patient Positioning: Upright in bed (needed full positioning support) Baseline Vocal Quality: Normal;Low vocal intensity (minimal verbal engagement -- suspect impact from Dementia) Volitional Cough:  (adequate (cued)) Volitional Swallow: Unable to elicit    Oral/Motor/Sensory Function Overall Oral Motor/Sensory Function: Within functional limits   Ice Chips Ice chips: Within functional limits Presentation: Spoon (fed; 2 trials)   Thin Liquid Thin Liquid: Within functional limits (grossly) Presentation: Cup;Self Fed;Straw (6 via cup; 5 via straw(then 3 multiple sips)) Other Comments: mild throat clearing noted w/ straw sipping    Nectar Thick Nectar Thick Liquid: Not tested   Honey Thick Honey Thick Liquid: Not tested   Puree Puree: Within functional limits Presentation: Spoon (fed; 8 trials)   Solid     Solid: Impaired (min+) Presentation: Spoon (fed; 6 trials) Oral Phase Impairments: Impaired mastication (ill-fitting lower denture plate) Oral Phase Functional Implications: Impaired mastication (ill-fitting lower denture plate impacting mastication) Pharyngeal Phase Impairments:  (none) Other Comments: time needed to fully clear         Jerilynn Som, MS,  CCC-SLP Speech Language Pathologist Rehab Services; [facility] [phone] (ascom) Kensleigh Gates 12/01/2021,10:39 AM

## 2021-12-01 NOTE — Assessment & Plan Note (Signed)
Comfort care  °

## 2021-12-01 NOTE — Discharge Summary (Signed)
Physician Discharge Summary   Patient: Andre Lewis MRN: [id number] DOB: 09-Dec-1931  Admit date:     11/25/2021  Discharge date: SEE BELOW   Discharge Physician: Andre Lewis   ADDENDED D/C DATE 12/02/21  PCP: Andre Lewis   Recommendations at discharge:   Hospice at [facility]  Discharge Diagnoses: Principal Problem:   Senile dementia, delirium, with behavioral disturbance ([place]) Active Problems:   UTI (urinary tract infection)   Acute diastolic (congestive) heart failure (HCC)   AKI (acute kidney injury) ([place])   Diabetes mellitus without complication ([place])   Accelerated hypertension   Pressure injury of skin   Fall   Pulmonary embolism [facility])  Hospital Course: 88 year old male with past medical history of diabetes mellitus diet-controlled, senile dementia and PE on Xarelto who was brought [facility] from his skilled nursing facility on 5/17 after falling twice.  Normally patient is ambulatory with minimal assistance from cane and recognizes family members although patient according to his sister-Lewis-law is quite confused and agitated.  Not his baseline.  Lewis the emergency room, noted to have elevated blood pressures with systolic Lewis the [id number].  CT scan of head with severe motion artifact and unable to fully rule out CVA.  Labs otherwise unremarkable other than some acute kidney injury.  Patient brought Lewis for further evaluation and treatment.  Patient noted to have urinary retention and coud catheter placed.  BNP on 5/18 elevated at 345 and patient started on some IV Lasix.  He had significant urinary output and pressures have improved.  Repeat urinalysis done 5/19 noted UTI which has grown out Pseudomonas.  Antibiotics adjusted.  Patient has since been more awake and interactive and appropriate, although p.o. intake still remains minimal.  Assessment and Plan: * Senile dementia, delirium, with behavioral disturbance (HCC) UTI (urinary tract infection) Acute diastolic (congestive)  heart failure (HCC) AKI (acute kidney injury) ([place]) Accelerated hypertension Diabetes mellitus without complication (HCC)  Pressure injury of skin Pressure Injury 11/25/21 Groin Right Deep Tissue Pressure Injury - Purple or maroon localized area of discolored intact skin or blood-filled blister due to damage of underlying soft tissue from pressure and/or shear. (Active)  11/25/21 1702  Location: Groin  Location Orientation: Right  Staging: Deep Tissue Pressure Injury - Purple or maroon localized area of discolored intact skin or blood-filled blister due to damage of underlying soft tissue from pressure and/or shear.  Wound Description (Comments):   Present on Admission: Yes    Noted pressure ulcers on groin, penis and scrotum, stage II.  Present on admission.  Also with right elbow wound abrasions. Appreciate wound care consult.  Fall Pulmonary embolism [facility])  Goals of care After long discussion with patient's HCPOA decision was made for comfort care.  He is being discharged back to [facility] which with hospice -Overall very poor prognosis.         Disposition: Long term care facility/hospice at [place] recommendation:  Discharge Diet Orders (From admission, onward)     Start     Ordered   12/01/21 0000  Diet - low sodium heart healthy        12/01/21 1520           Dysphagia type 3 thin Liquid DISCHARGE MEDICATION: Allergies as of 12/01/2021   No Known Allergies      Medication List     STOP taking these medications    tamsulosin 0.4 MG Caps capsule Commonly known as: FLOMAX   Xarelto 15 MG Tabs tablet  Generic drug: Rivaroxaban       TAKE these medications    acetaminophen 325 MG tablet Commonly known as: TYLENOL Take 650 mg by mouth 2 (two) times daily.   Desitin 13 % Crea Generic drug: Zinc Oxide Apply 1 application. topically daily as needed (for redness). Apply to sacrum   divalproex 125 MG DR tablet Commonly known as:  DEPAKOTE Take 125 mg by mouth at bedtime.   docusate sodium 100 MG capsule Commonly known as: COLACE Take 100 mg by mouth daily.   fluticasone 50 MCG/ACT nasal spray Commonly known as: FLONASE Place 1 spray into both nostrils daily.   hydrocortisone cream 1 % Apply 1 application. topically 2 (two) times daily as needed for itching. Apply to both arms   magnesium oxide 400 MG tablet Commonly known as: MAG-OX Take 400 mg by mouth daily.   melatonin 3 MG Tabs tablet Take 3 mg by mouth at bedtime.   olopatadine 0.1 % ophthalmic solution Commonly known as: PATANOL Place 1 drop into both eyes 2 (two) times daily.   REFRESH LIQUIGEL OP Apply 2 drops to eye 2 (two) times daily as needed (dry eyes).   traMADol 50 MG tablet Commonly known as: ULTRAM Take 50 mg by mouth 2 (two) times daily as needed for pain.   traZODone 50 MG tablet Commonly known as: DESYREL Take 75 mg by mouth at bedtime.   Vitamin D3 50 MCG (2000 UT) Tabs Take 2,000 Units by mouth daily.               Discharge Care Instructions  (From admission, onward)           Start     Ordered   12/01/21 0000  Discharge wound care:       Comments: As above   12/01/21 1520            Discharge Exam: Filed Weights   11/25/21 [id number]  Weight: 65 kg   General: Oriented x1, appropriate HEENT: Normocephalic atraumatic, mucous membranes are dry Cardiovascular: Regular rate and rhythm, S1-S2 Respiratory: Clear auscultation bilaterally Abdomen: Soft, nontender, nondistended, positive bowel sounds Musculoskeletal: No clubbing or cyanosis or edema Skin: As noted above Lewis assessment and plan.  stage II ulcers to groin including penis and scrotum. Psychiatry: Underlying dementia Neurology: No focal deficits  Condition at discharge: poor  The results of significant diagnostics from this hospitalization (including imaging, microbiology, ancillary and laboratory) are listed below for reference.   Imaging  Studies: CT HEAD WO CONTRAST (5MM)  Result Date: 11/25/2021 CLINICAL DATA:  88 year old male status post witnessed fall. EXAM: CT HEAD WITHOUT CONTRAST TECHNIQUE: Contiguous axial images were obtained from the base of the skull through the vertex without intravenous contrast. RADIATION DOSE REDUCTION: This exam was performed according to the departmental dose-optimization program which includes automated exposure control, adjustment of the mA and/or kV according to patient size and/or use of iterative reconstruction technique. COMPARISON:  None Available. FINDINGS: Study is moderately degraded by motion artifact despite repeated imaging attempts. Brain: No midline shift, mass effect, or evidence of intracranial mass lesion. No ventriculomegaly. No acute intracranial hemorrhage identified. No definite acute cortically based infarct. Vascular: Calcified atherosclerosis at the skull base. No suspicious intracranial vascular hyperdensity. Skull: Degraded by motion.  No acute osseous abnormality identified. Sinuses/Orbits: Scattered bilateral mucoperiosteal thickening, most pronounced Lewis the ethmoid and sphenoid sinuses. Tympanic cavities and mastoids appear clear. Other: No definite orbit or scalp soft tissue injury. IMPRESSION: 1. Study is moderately degraded  by motion artifact despite repeated imaging attempts. 2. No acute intracranial abnormality or acute traumatic injury identified. 3. Bilateral paranasal sinus disease. Electronically Signed   By: Genevie Ann M.D.   On: 11/25/2021 07:45   CT Cervical Spine Wo Contrast  Result Date: 11/25/2021 CLINICAL DATA:  88 year old male status post witnessed fall. EXAM: CT CERVICAL SPINE WITHOUT CONTRAST TECHNIQUE: Multidetector CT imaging of the cervical spine was performed without intravenous contrast. Multiplanar CT image reconstructions were also generated. RADIATION DOSE REDUCTION: This exam was performed according to the departmental dose-optimization program which  includes automated exposure control, adjustment of the mA and/or kV according to patient size and/or use of iterative reconstruction technique. COMPARISON:  Head CT today. FINDINGS: Study is moderately degraded by motion artifact despite repeated imaging attempts. Alignment: Straightening of cervical lordosis, mild reversal Lewis the upper cervical spine. Cervicothoracic junction alignment is within normal limits. Posterior element alignment appears maintained. Skull base and vertebrae: Grossly intact skull base. Maintained C1-C2 alignment. Limited cervical vertebral detail due to motion. No acute osseous abnormality identified. Soft tissues and spinal canal: No prevertebral fluid or swelling. No visible canal hematoma. Bilateral calcified carotid atherosclerosis. Disc levels: Widespread advanced disc and endplate degeneration. Spinal stenosis appears most pronounced at C3-C4. Upper chest: Mild chronic appearing anterior wedging of the T1 vertebral body. Osteopenia. Lung apices are clear. IMPRESSION: 1. Moderately degraded by motion artifact despite repeated imaging attempts. 2. No acute traumatic injury identified Lewis the cervical spine. 3. Widespread cervical spine degeneration. Evidence of spinal stenosis at C3-C4. Electronically Signed   By: Genevie Ann M.D.   On: 11/25/2021 07:48   DG Chest Port 1 View  Result Date: 11/27/2021 CLINICAL DATA:  Pneumonia EXAM: PORTABLE CHEST 1 VIEW COMPARISON:  None Available. FINDINGS: Midline trachea. Mild cardiomegaly. Atherosclerosis Lewis the transverse aorta. Trace left pleural fluid. No pneumothorax. Mild hyperinflation. Linear right infrahilar opacity likely represents scar. Minimal left lower lobe airspace disease. IMPRESSION: Trace left pleural fluid with mild left base airspace disease which could represent atelectasis or infection/aspiration. Cardiomegaly without congestive failure. Aortic Atherosclerosis (ICD10-I70.0). Electronically Signed   By: Abigail Miyamoto M.D.   On:  11/27/2021 08:44   ECHOCARDIOGRAM COMPLETE  Result Date: 11/27/2021    ECHOCARDIOGRAM REPORT   Patient Name:   Andre Lewis Date of Exam: 11/27/2021 Medical Rec #:  [id number]    Height:       70.0 Lewis Accession #:    [id number]   Weight:       143.2 lb Date of Birth:  [birth year]     BSA:          1.811 m Patient Age:    14 years     BP:           123/63 mmHg Patient Gender: M            HR:           78 bpm. Exam Location:  [facility] Procedure: 2D Echo, Color Doppler and Cardiac Doppler Indications:     CHF-acute diastolic [id number]  History:         Patient has no prior history of Echocardiogram examinations.                  Risk Factors:Diabetes. Pulmonary embolus.  Sonographer:     Sherrie Sport Referring Phys:  [place] Diagnosing Phys: Yolonda Kida MD  Sonographer Comments: Technically challenging study due to limited acoustic windows, no apical window and no parasternal window.  Image acquisition challenging due to patient body habitus and Very bony thorax---only view obtainable was subcostal. IMPRESSIONS  1. Left ventricular ejection fraction, by estimation, is >75%. The left ventricle has hyperdynamic function. The left ventricle has no regional wall motion abnormalities. There is mild concentric left ventricular hypertrophy. Left ventricular diastolic parameters are consistent with Grade I diastolic dysfunction (impaired relaxation).  2. Right ventricular systolic function is normal. The right ventricular size is normal. Mildly increased right ventricular wall thickness.  3. The mitral valve is grossly normal. Mild mitral valve regurgitation.  4. The aortic valve is grossly normal. Aortic valve regurgitation is mild. FINDINGS  Left Ventricle: Left ventricular ejection fraction, by estimation, is >75%. The left ventricle has hyperdynamic function. The left ventricle has no regional wall motion abnormalities. The left ventricular internal cavity size was normal Lewis size. There is mild concentric left  ventricular hypertrophy. Left ventricular diastolic parameters are consistent with Grade I diastolic dysfunction (impaired relaxation). Right Ventricle: The right ventricular size is normal. Mildly increased right ventricular wall thickness. Right ventricular systolic function is normal. Left Atrium: Left atrial size was normal Lewis size. Right Atrium: Right atrial size was normal Lewis size. Pericardium: Trivial pericardial effusion is present. Mitral Valve: The mitral valve is grossly normal. Mild mitral valve regurgitation. Tricuspid Valve: The tricuspid valve is normal Lewis structure. Tricuspid valve regurgitation is mild. Aortic Valve: The aortic valve is grossly normal. Aortic valve regurgitation is mild. Pulmonic Valve: The pulmonic valve was normal Lewis structure. Pulmonic valve regurgitation is trivial. Aorta: The ascending aorta was not well visualized. IAS/Shunts: No atrial level shunt detected by color flow Doppler.  LEFT VENTRICLE PLAX 2D LVIDd:         3.90 cm LVIDs:         2.20 cm LV PW:         1.20 cm LV IVS:        1.00 cm  LEFT ATRIUM         Index LA diam:    4.30 cm 2.37 cm/m  PULMONIC VALVE PV Vmax:        1.16 m/s PV Vmean:       88.900 cm/s PV VTI:         0.132 m PV Peak grad:   5.4 mmHg PV Mean grad:   4.0 mmHg RVOT Peak grad: 7 mmHg   SHUNTS Pulmonic VTI: 0.165 m Yolonda Kida MD Electronically signed by Yolonda Kida MD Signature Date/Time: 11/27/2021/11:38:47 PM    Final     Microbiology: Results for orders placed or performed during the hospital encounter of 11/25/21  MRSA Next Gen by PCR, Nasal     Status: Abnormal   Collection Time: 11/27/21  2:35 AM   Specimen: Nasal Mucosa; Nasal Swab  Result Value Ref Range Status   MRSA by PCR Next Gen DETECTED (A) NOT DETECTED Final    Comment: RESULT CALLED TO, READ BACK BY AND VERIFIED WITH: PATRICK TUTTLE AT [id number] 11/27/21.PMF (NOTE) The GeneXpert MRSA Assay (FDA approved for NASAL specimens only), is one component of a  comprehensive MRSA colonization surveillance program. It is not intended to diagnose MRSA infection nor to guide or monitor treatment for MRSA infections. Test performance is not FDA approved Lewis patients less than 66 years old. Performed at [facility], [address] [place], [place] [address]   Urine Culture     Status: Abnormal   Collection Time: 11/27/21  9:10 AM   Specimen: Urine, Clean  Catch  Result Value Ref Range Status   Specimen Description   Final    URINE, CLEAN CATCH Performed at [facility], [address] [place], [place] [address]    Special Requests   Final    NONE Performed at [facility], [address] [place], [place] [address]    Culture >=100,000 COLONIES/mL PSEUDOMONAS AERUGINOSA (A)  Final   Report Status 11/29/2021 FINAL  Final   Organism ID, Bacteria PSEUDOMONAS AERUGINOSA (A)  Final      Susceptibility   Pseudomonas aeruginosa - MIC*    CEFTAZIDIME 4 SENSITIVE Sensitive     CIPROFLOXACIN <=0.25 SENSITIVE Sensitive     GENTAMICIN <=1 SENSITIVE Sensitive     IMIPENEM 1 SENSITIVE Sensitive     PIP/TAZO 8 SENSITIVE Sensitive     CEFEPIME 2 SENSITIVE Sensitive     * >=100,000 COLONIES/mL PSEUDOMONAS AERUGINOSA    Labs: CBC: Recent Labs  Lab 11/25/21 0644 11/27/21 0403 11/28/21 0438  WBC 6.7 5.9 4.7  HGB 11.6* 11.4* 11.1*  HCT 33.7* 33.5* 32.9*  MCV 100.3* 99.7 100.6*  PLT 170 134* [id number]*   Basic Metabolic Panel: Recent Labs  Lab 11/27/21 0403 11/28/21 0438 11/29/21 0637 11/30/21 0357 12/01/21 0503  NA 139 140 140 138 139  K 3.7 3.8 3.7 3.3* 3.8  CL 103 106 107 103 103  CO2 25 26 26 29 29   GLUCOSE 157* 156* 157* 154* 137*  BUN 29* 32* 27* 27* 30*  CREATININE 2.00* 1.24 1.01 0.97 1.10  CALCIUM 9.0 9.2 9.1 9.1 9.1   Liver Function Tests: Recent Labs  Lab 11/25/21 0644  AST 24  ALT 18  ALKPHOS 74  BILITOT 0.6  PROT 6.9  ALBUMIN 3.6   CBG: Recent Labs  Lab 11/30/21 1205 11/30/21 1734  11/30/21 1947 12/01/21 0801 12/01/21 1208  GLUCAP 135* 177* 143* 141* 169*    Discharge time spent: greater than 30 minutes.  Signed: Max Sane, MD [facility] 12/01/2021

## 2021-12-01 NOTE — Progress Notes (Addendum)
[facility]  [facility]) Hospital Liaison Note   Received request from Transitions of Care Manager, Michelle Nasuti, for hospice services at home after discharge. Chart and patient information under review by [facility] physician. Hospice eligibility approved.   Spoke with SIL/Marilyn Staples: [phone] initiate education related to hospice philosophy, services, and team approach to care. Jola Babinski verbalized understanding of information given. Per discussion, the plan is for patient to discharge [facility] memory care via [facility] once cleared to DC.    DME needs discussed. Patient has the following equipment in the home (Purchased privately): [place] Patient requests the following equipment for delivery: Denied any DME needs  Address is incorrect in the chart. Patient to D/C to: [facility] [address],  [place], [place] [address]  Addendum: MSW notified by [facility] supervisor/Susan that MR is requesting hospital bed is ordered/delivered prior to patients return to facility. DME ordered. Per [facility] staff, patient has an admission scheduled for 1p tomorrow pm  Please send signed and completed DNR home with patient/family. Please provide prescriptions at discharge as needed to ensure ongoing symptom management.    [facility] information and contact numbers given to family & above information shared with TOC.   Please call with any questions/concerns.    Thank you for the opportunity to participate in this patient's care.   Odette Fraction, MSW [facility] Liaison  [phone]

## 2021-12-01 NOTE — TOC Progression Note (Signed)
Transition of Care [facility]) - Progression Note    Patient Details  Name: Kashif Pooler MRN: [id number] Date of Birth: 04-Oct-1931  Transition [facility]) CM/SW Contact  Allayne Butcher, RN Phone Number: 12/01/2021, 3:41 PM  Clinical Narrative:    Sherron Monday with [place] at [facility] about patient returning today, she replies that they are required to come out and do an assessment before a resident can return.  She cannot come immediately but maybe sometime this afternoon or in the morning.  Patient will be able to discharge back to [facility] with [facility].  Harley Alto  with [facility] is aware and will follow up to see if patient will require any equipment like a hospital bed.    FL2 will be secure emailed to [email or web address].    Expected Discharge Plan: Memory Care Barriers to Discharge: Barriers Resolved  Expected Discharge Plan and Services Expected Discharge Plan: Memory Care   Discharge Planning Services: CM Consult   Living arrangements for the past 2 months: Assisted Living Facility (Memory care) Expected Discharge Date: 12/01/21               DME Arranged: N/A DME Agency: NA       HH Arranged: NA HH Agency: [facility] Date HH Agency Contacted: 12/01/21 Time HH Agency Contacted: 1354 Representative spoke with at [facility] Agency: [place]   Social Determinants of Health (SDOH) Interventions    Readmission Risk Interventions     View : No data to display.

## 2021-12-01 NOTE — TOC Progression Note (Addendum)
Transition of Care [facility]) - Progression Note    Patient Details  Name: Andre Lewis MRN: [id number] Date of Birth: 15-Jun-1932  Transition of Care [facility]) CM/SW Contact  Caryn Section, RN Phone Number: 12/01/2021, 1:53 PM  Clinical Narrative:   Patient is a resident of [facility], anticipating return today.  Per care team, no equipment is required.  [place] from [facility] confirmed they will provide home health at facility for patient  Addendum 1440 patient not anticipated to return today, anticipated to return to facility when discharged.  Expected Discharge Plan: Memory Care Barriers to Discharge: Barriers Resolved  Expected Discharge Plan and Services Expected Discharge Plan: Memory Care   Discharge Planning Services: CM Consult   Living arrangements for the past 2 months: Assisted Living Facility                 DME Arranged: N/A DME Agency: NA       HH Arranged: NA HH Agency: NA         Social Determinants of Health (SDOH) Interventions    Readmission Risk Interventions     View : No data to display.

## 2021-12-01 NOTE — Assessment & Plan Note (Signed)
Comfort care/hospice at [facility] which tomorrow

## 2021-12-01 NOTE — IPAL (Signed)
  Interdisciplinary Goals of Care Family Meeting   Date carried out: 12/01/2021  Location of the meeting: Bedside  Member's involved: Physician and Family Member or next of kin  Durable Power of Attorney or acting medical decision maker: Marilyn/sister-in-law and HCPOA  Discussion: We discussed goals of care for [name] .    Code status: Full DNR  Disposition: Home with Hospice  Time spent for the meeting: 15 minutes    Andre Lovett, MD  12/01/2021, 3:30 PM

## 2021-12-01 NOTE — [place] FL2 (Signed)
[place] MEDICAID FL2 LEVEL OF CARE SCREENING TOOL     IDENTIFICATION  Patient Name: Andre Lewis Birthdate: Sep 15, 1931 Sex: male Admission Date (Current Location): 11/25/2021  [employer] and [place] Number:  Chiropodist and Address:  [facility], [address] [place], [place] [address]      Provider Number: [id number]  Attending Physician Name and Address:  Delfino Lovett, MD  Relative Name and Phone Number:  Caryl Bis- sister- [phone]    Current Level of Care: Hospital Recommended Level of Care: Assisted Living Facility, Memory Care Prior Approval Number:    Date Approved/Denied:   PASRR Number:    Discharge Plan: Domiciliary (Rest home) (Memory Care ALF)    Current Diagnoses: Patient Active Problem List   Diagnosis Date Noted   Goals of care, counseling/discussion    Delirium    UTI (urinary tract infection) 11/27/2021   Acute diastolic (congestive) heart failure (HCC) 11/27/2021   AKI (acute kidney injury) (HCC) 11/27/2021   Accelerated hypertension 11/26/2021   Senile dementia, delirium, with behavioral disturbance (HCC) 11/25/2021   Fall 11/25/2021   Pulmonary embolism (HCC) 11/25/2021   Diabetes mellitus without complication (HCC) 11/25/2021   Pressure injury of skin 11/25/2021   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) 08/11/2021    Orientation RESPIRATION BLADDER Height & Weight        Normal Indwelling catheter Weight: 65 kg Height:  5\' 10"  (177.8 cm)  BEHAVIORAL SYMPTOMS/MOOD NEUROLOGICAL BOWEL NUTRITION STATUS      Incontinent Diet (Regular diet- thin liquids)  AMBULATORY STATUS COMMUNICATION OF NEEDS Skin   Extensive Assist Verbally PU Stage and Appropriate Care, Other (Comment) (MASD to goin, skin tears right arm)     PU Stage 3 Dressing: Daily                 Personal Care Assistance Level of Assistance  Bathing, Feeding, Dressing Bathing Assistance: Maximum assistance Feeding assistance:  Limited assistance Dressing Assistance: Maximum assistance     Functional Limitations Info             SPECIAL CARE FACTORS FREQUENCY  PT (By licensed PT)     PT Frequency: not applicable- hospice patient              Contractures Contractures Info: Not present    Additional Factors Info  Code Status, Allergies Code Status Info: DNR Allergies Info: NKA           Current Medications (12/01/2021):  This is the current hospital active medication list Current Facility-Administered Medications  Medication Dose Route Frequency Provider Last Rate Last Admin   0.9 %  sodium chloride infusion   Intravenous PRN 12/03/2021, MD   Stopped at 11/29/21 1328   acetaminophen (TYLENOL) tablet 650 mg  650 mg Oral Q6H PRN 12/01/21, MD   650 mg at 11/30/21 2156   ceFEPIme (MAXIPIME) 2 g in sodium chloride 0.9 % 100 mL IVPB  2 g Intravenous Q12H 2157, MD 200 mL/hr at 12/01/21 0823 2 g at 12/01/21 12/03/21   Chlorhexidine Gluconate Cloth 2 % PADS 6 each  6 each Topical Daily 4193, MD       divalproex (DEPAKOTE) DR tablet 125 mg  125 mg Oral Daily Delfino Lovett, MD   125 mg at 12/01/21 0816   furosemide (LASIX) injection 10 mg  10 mg Intravenous Daily 12/03/21, MD   10 mg at 12/01/21 0817   hydrALAZINE (APRESOLINE) injection 5 mg  5 mg Intravenous Q2H PRN Lorretta Harp, MD   5 mg at 11/25/21 2225   insulin aspart (novoLOG) injection 0-5 Units  0-5 Units Subcutaneous QHS Hollice Espy, MD       insulin aspart (novoLOG) injection 0-9 Units  0-9 Units Subcutaneous TID WC Hollice Espy, MD   2 Units at 12/01/21 1214   magic mouthwash  5 mL Oral QID PRN Hollice Espy, MD       naphazoline-glycerin (CLEAR EYES REDNESS) ophth solution 1-2 drop  1-2 drop Both Eyes QID Hollice Espy, MD   2 drop at 12/01/21 1417   ondansetron (ZOFRAN) injection 4 mg  4 mg Intravenous Q8H PRN Lorretta Harp, MD       Rivaroxaban Carlena Hurl) tablet 15 mg  15 mg Oral Q supper  Lorretta Harp, MD   15 mg at 11/30/21 1742   tamsulosin (FLOMAX) capsule 0.4 mg  0.4 mg Oral Daily Lorretta Harp, MD   0.4 mg at 12/01/21 8127   traZODone (DESYREL) tablet 75 mg  75 mg Oral Marice Potter, MD   75 mg at 11/30/21 2156     Discharge Medications: Please see discharge summary for a list of discharge medications.  Relevant Imaging Results:  Relevant Lab Results:   Additional Information Hospice patient with [facility]  Allayne Butcher, RN

## 2021-12-01 NOTE — Progress Notes (Signed)
Progress Note   Patient: Andre Lewis OZD:[id number] DOB: February 27, 1932 DOA: 11/25/2021     5 DOS: the patient was seen and examined on 12/01/2021   Brief hospital course: 88 year old male with past medical history of diabetes mellitus diet-controlled, senile dementia and PE on Xarelto who was brought in from his skilled nursing facility on 5/17 after falling twice.  Normally patient is ambulatory with minimal assistance from cane and recognizes family members although patient according to his sister-in-law is quite confused and agitated.  Not his baseline.  In the emergency room, noted to have elevated blood pressures with systolic in the 170s.  CT scan of head with severe motion artifact and unable to fully rule out CVA.  Labs otherwise unremarkable other than some acute kidney injury.  Patient brought in for further evaluation and treatment.  Patient noted to have urinary retention and coud catheter placed.  BNP on 5/18 elevated at 345 and patient started on some IV Lasix.  He had significant urinary output and pressures have improved.  Repeat urinalysis done 5/19 noted UTI which has grown out Pseudomonas.  Antibiotics adjusted.  Patient has since been more awake and interactive and appropriate, although p.o. intake still remains minimal.  5/23: Hospice at [facility] tomorrow   Assessment and Plan: * Senile dementia, delirium, with behavioral disturbance (HCC) Secondary to UTI.  Treated  UTI (urinary tract infection) Treated with antibiotics  Acute diastolic (congestive) heart failure (HCC) Well compensated at this time.  Echocardiogram noted grade 1 diastolic dysfunction.   AKI (acute kidney injury) [facility]) Lab Results  Component Value Date   CREATININE 1.10 12/01/2021   CREATININE 0.97 11/30/2021   CREATININE 1.01 11/29/2021  Resolved with hydration.  Likely prerenal  Accelerated hypertension Comfort care  Diabetes mellitus without complication (HCC) Recent A1c 7.3.  Pressure  injury of skin Pressure Injury 11/25/21 Groin Right Deep Tissue Pressure Injury - Purple or maroon localized area of discolored intact skin or blood-filled blister due to damage of underlying soft tissue from pressure and/or shear. (Active)  11/25/21 1702  Location: Groin  Location Orientation: Right  Staging: Deep Tissue Pressure Injury - Purple or maroon localized area of discolored intact skin or blood-filled blister due to damage of underlying soft tissue from pressure and/or shear.  Wound Description (Comments):   Present on Admission: Yes    Noted pressure ulcers on groin, penis and scrotum, stage II.  Present on admission.  Also with right elbow wound abrasions. Appreciate wound care consult  Fall -Fall precaution  Pulmonary embolism [facility]) Patient will be on comfort care/hospice.  Stop Xarelto as this is a very old history of PE and no benefit at this point being on comfort care  Delirium Comfort care  Goals of care, counseling/discussion Comfort care/hospice at [facility] which tomorrow        Subjective: Sleepy.  Sister-in-law/HCPOA at bedside  Physical Exam: Vitals:   11/30/21 2118 12/01/21 0546 12/01/21 0832 12/01/21 1557  BP: (!) 142/69 (!) 144/67 136/64 131/71  Pulse: 64 (!) 56 63 69  Resp: 16 16 14 17   Temp: 98.3 F (36.8 C) 97.7 F (36.5 C) 98.4 F (36.9 C) 98.7 F (37.1 C)  TempSrc:      SpO2: 99% 96% 97% 97%  Weight:      Height:       . General: Sleeping comfortably HEENT: Normocephalic atraumatic, mucous membranes are dry . Cardiovascular: Regular rate and rhythm, S1-S2 . Respiratory: Clear auscultation bilaterally . Abdomen: Soft, nontender, nondistended, positive bowel  sounds . Musculoskeletal: No clubbing or cyanosis or edema . Skin: As noted above in assessment and plan.  stage II ulcers to groin including penis and scrotum. Marland Kitchen Psychiatry: Underlying dementia . Neurology: No focal deficits .  Data Reviewed:  There are no new results to  review at this time.  Family Communication: Sister-in-law/HCPOA updated at bedside  Disposition: Status is: Inpatient Remains inpatient appropriate because: Waiting for hospital bed delivery at [facility].  Discharge tomorrow back to [facility] with hospice   Planned Discharge Destination: [facility] with hospice tomorrow.  Waiting for hospital bed delivery tomorrow morning    DVT prophylaxis-comfort care Time spent: 35 minutes  Author: Delfino Lovett, MD 12/01/2021 4:08 PM  For on call review [email or web address].

## 2021-12-02 LAB — BASIC METABOLIC PANEL
Anion gap: 9 (ref 5–15)
BUN: 33 mg/dL — ABNORMAL HIGH (ref 8–23)
CO2: 28 mmol/L (ref 22–32)
Calcium: 9.2 mg/dL (ref 8.9–10.3)
Chloride: 103 mmol/L (ref 98–111)
Creatinine, Ser: 1.01 mg/dL (ref 0.61–1.24)
GFR, Estimated: >60 mL/min (ref >60)
Glucose, Bld: 143 mg/dL — ABNORMAL HIGH (ref 70–99)
Potassium: 3.5 mmol/L (ref 3.5–5.1)
Sodium: 140 mmol/L (ref 135–145)

## 2021-12-02 LAB — GLUCOSE, CAPILLARY
Glucose-Capillary: 136 mg/dL — ABNORMAL HIGH (ref 70–99)
Glucose-Capillary: 129 mg/dL — ABNORMAL HIGH (ref 70–99)
Glucose-Capillary: 169 mg/dL — ABNORMAL HIGH (ref 70–99)
Glucose-Capillary: 179 mg/dL — ABNORMAL HIGH (ref 70–99)

## 2021-12-02 NOTE — TOC Progression Note (Signed)
Transition of Care [facility]) - Progression Note    Patient Details  Name: Andre Lewis MRN: [id number] Date of Birth: Nov 23, 1931  Transition of Care [facility]) CM/SW Contact  Caryn Section, RN Phone Number: 12/02/2021, 11:23 AM  Clinical Narrative:   As per Ancil Boozer, , facility is ready to accept patient.  Watt Climes from [facility] made aware, care team made aware.  RNCM contacted [facility] and family.     Expected Discharge Plan: Memory Care Barriers to Discharge: Barriers Resolved  Expected Discharge Plan and Services Expected Discharge Plan: Memory Care   Discharge Planning Services: CM Consult   Living arrangements for the past 2 months: Assisted Living Facility (Memory care) Expected Discharge Date: 12/01/21               DME Arranged: N/A DME Agency: NA       HH Arranged: NA HH Agency: [facility] Date HH Agency Contacted: 12/01/21 Time HH Agency Contacted: 1354 Representative spoke with at [facility] Agency: [place]   Social Determinants of Health (SDOH) Interventions    Readmission Risk Interventions     View : No data to display.

## 2021-12-02 NOTE — Progress Notes (Signed)
Staff from facility here requesting information about meals and voiding. 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

## 2021-12-02 NOTE — Progress Notes (Signed)
Per MD ok that patient does not have IV access.

## 2021-12-02 NOTE — Progress Notes (Signed)
[facility])    Please send signed and completed DNR with patient/family upon discharge. Please provide prescriptions at discharge as needed to ensure ongoing symptom management and a transport packet.   [facility] information and contact numbers given to family and above information shared with TOC.    Please call with any questions/concerns.    Thank you for the opportunity to participate in this patient's care   Odette Fraction, MSW [facility] Liaison  [phone]

## 2021-12-02 NOTE — Progress Notes (Signed)
88 year old male with past medical history of diabetes mellitus diet-controlled, senile dementia and PE on Xarelto who was brought in from his skilled nursing facility on 5/17 after falling twice.  Normally patient is ambulatory with minimal assistance from cane and recognizes family members although patient according to his sister-in-law is quite confused and agitated.  Not his baseline.  In the emergency room, noted to have elevated blood pressures with systolic in the 170s.  CT scan of head with severe motion artifact and unable to fully rule out CVA.  Labs otherwise unremarkable other than some acute kidney injury.  Patient brought in for further evaluation and treatment.  Patient noted to have urinary retention and coud catheter placed. BNP on 5/18 elevated at 345 and patient started on some IV Lasix.  He had significant urinary output and pressures have improved.  Repeat urinalysis done 5/19 noted UTI which has grown out Pseudomonas.  Antibiotics adjusted s/op cefepime compelted 5/23.Patient has since been more awake and interactive and appropriate, although p.o. intake still remains minimal. 5/23: Hospice at [facility] 5/24   DC summary has been completed 5/23 discharge date 5/24[facility] able to accept the patient under hospice care today.

## 2021-12-02 NOTE — Care Management Important Message (Signed)
Important Message  Patient Details  Name: Andre Lewis MRN: [id number] Date of Birth: 1932/04/29   Medicare Important Message Given:  Other (see comment)  Patient returning to ALF with Hospice Care. Out of respect for the patient and family and no Important Message from [facility] given.   Olegario Messier A Nthony Lefferts 12/02/2021, 1:34 PM

## 2021-12-02 NOTE — TOC Progression Note (Addendum)
Transition of Care [facility]) - Progression Note    Patient Details  Name: Andre Lewis MRN: [id number] Date of Birth: [birth year]  Transition of Care [facility]) CM/SW Contact  Caryn Section, RN Phone Number: 12/02/2021, 1:20 PM  Clinical Narrative:   TOC offered EMS to transport patient.  Nursing staff advised this would be the optimal method of transport. [facility] advised, however they stated they will transport patient to facility today via their [facility]ena[facility] Niece transport.  Nursing to notify family, [facility] also stated they would notify family.  Hospital bed at facility per [facility]  Addendum 1632:   patient will stay overnight, as catheter will be removed and staff will assess voiding pattern.  Expected Discharge Plan: Memory Care Barriers to Discharge: Barriers Resolved  Expected Discharge Plan and Services Expected Discharge Plan: Memory Care   Discharge Planning Services: CM Consult   Living arrangements for the past 2 months: Assisted Living Facility (Memory care) Expected Discharge Date: 12/02/21               DME Arranged: N/A DME Agency: NA       HH Arranged: NA HH Agency: [facility] Date HH Agency Contacted: 12/01/21 Time HH Agency Contacted: 1354 Representative spoke with at [facility] Agency: [place]   Social Determinants of Health (SDOH) Interventions    Readmission Risk Interventions     View : No data to display.

## 2021-12-02 NOTE — Progress Notes (Signed)
SLP Cancellation Note  Patient Details Name: Andre Lewis MRN: [id number] DOB: 04/04/1932   Cancelled treatment:       Reason Eval/Treat Not Completed:  (chart reviewed) Per MD note and discharge plans, pt is transferring back to [facility] under Hospice care today. Any further swallowing needs can be addressed by support services/ST services at [facility] if needed. Recommend continued aspiration precautions and pills crushed in Puree for swallowing.      Jerilynn Som, MS, CCC-SLP Speech Language Pathologist Rehab Services; [facility] [phone] (ascom) Zahli Vetsch 12/02/2021, 1:07 PM

## 2021-12-03 DIAGNOSIS — N3000 Acute cystitis without hematuria: Secondary | ICD-10-CM | POA: Diagnosis not present

## 2021-12-03 DIAGNOSIS — E43 Unspecified severe protein-calorie malnutrition: Secondary | ICD-10-CM | POA: Insufficient documentation

## 2021-12-03 DIAGNOSIS — F03918 Unspecified dementia, unspecified severity, with other behavioral disturbance: Secondary | ICD-10-CM | POA: Diagnosis not present

## 2021-12-03 DIAGNOSIS — I5032 Chronic diastolic (congestive) heart failure: Secondary | ICD-10-CM

## 2021-12-03 DIAGNOSIS — R627 Adult failure to thrive: Secondary | ICD-10-CM | POA: Diagnosis not present

## 2021-12-03 DIAGNOSIS — L89303 Pressure ulcer of unspecified buttock, stage 3: Secondary | ICD-10-CM

## 2021-12-03 DIAGNOSIS — Z515 Encounter for palliative care: Secondary | ICD-10-CM

## 2021-12-03 LAB — GLUCOSE, CAPILLARY
Glucose-Capillary: 151 mg/dL — ABNORMAL HIGH (ref 70–99)
Glucose-Capillary: 139 mg/dL — ABNORMAL HIGH (ref 70–99)
Glucose-Capillary: 239 mg/dL — ABNORMAL HIGH (ref 70–99)

## 2021-12-03 LAB — SARS CORONAVIRUS 2 BY RT PCR: SARS Coronavirus 2 by RT PCR: POSITIVE — AB

## 2021-12-03 MED ORDER — ACETAMINOPHEN 325 MG PO TABS: 650.0000 mg | ORAL_TABLET | Freq: Four times a day (QID) | ORAL | Status: DC | PRN

## 2021-12-03 MED ORDER — BIOTENE DRY MOUTH MT LIQD: 15.0000 mL | OROMUCOSAL | 0 refills | Status: AC | PRN

## 2021-12-03 MED ORDER — HALOPERIDOL LACTATE 2 MG/ML PO CONC: 0.6000 mg | ORAL | 0 refills | Status: DC | PRN

## 2021-12-03 MED ORDER — GLYCOPYRROLATE 0.2 MG/ML IJ SOLN: 0.2000 mg | INTRAMUSCULAR | 0 refills | Status: AC | PRN

## 2021-12-03 MED ORDER — GLYCOPYRROLATE 0.2 MG/ML IJ SOLN: 0.2000 mg | INTRAMUSCULAR | Status: DC | PRN

## 2021-12-03 MED ORDER — POLYVINYL ALCOHOL 1.4 % OP SOLN: 1.0000 [drp] | Freq: Four times a day (QID) | OPHTHALMIC | 0 refills | Status: AC | PRN

## 2021-12-03 MED ORDER — TAMSULOSIN HCL 0.4 MG PO CAPS
0.4000 mg | ORAL_CAPSULE | Freq: Every day | ORAL | Status: DC
  Administered 2021-12-03: 0.4 mg via ORAL
  Filled 2021-12-03: qty 1

## 2021-12-03 MED ORDER — MORPHINE SULFATE (CONCENTRATE) 10 MG/0.5ML PO SOLN: 5.0000 mg | ORAL | 0 refills | Status: DC | PRN

## 2021-12-03 MED ORDER — ONDANSETRON 4 MG PO TBDP: 4.0000 mg | ORAL_TABLET | Freq: Four times a day (QID) | ORAL | Status: DC | PRN

## 2021-12-03 MED ORDER — MORPHINE SULFATE (CONCENTRATE) 10 MG/0.5ML PO SOLN: 5.0000 mg | ORAL | Status: DC | PRN

## 2021-12-03 MED ORDER — MORPHINE SULFATE (CONCENTRATE) 10 MG/0.5ML PO SOLN: 5.0000 mg | ORAL | 0 refills | Status: AC | PRN

## 2021-12-03 MED ORDER — ONDANSETRON HCL 4 MG/2ML IJ SOLN: 4.0000 mg | Freq: Four times a day (QID) | INTRAMUSCULAR | Status: DC | PRN

## 2021-12-03 MED ORDER — BIOTENE DRY MOUTH MT LIQD: 15.0000 mL | OROMUCOSAL | Status: DC | PRN

## 2021-12-03 MED ORDER — ADULT MULTIVITAMIN W/MINERALS CH
1.0000 | ORAL_TABLET | Freq: Every day | ORAL | Status: DC
  Administered 2021-12-03: 1 via ORAL
  Filled 2021-12-03: qty 1

## 2021-12-03 MED ORDER — HALOPERIDOL 0.5 MG PO TABS: 0.5000 mg | ORAL_TABLET | ORAL | Status: DC | PRN

## 2021-12-03 MED ORDER — ACETAMINOPHEN 325 MG PO TABS: 650.0000 mg | ORAL_TABLET | Freq: Four times a day (QID) | ORAL | Status: AC | PRN

## 2021-12-03 MED ORDER — ACETAMINOPHEN 650 MG RE SUPP: 650.0000 mg | Freq: Four times a day (QID) | RECTAL | Status: DC | PRN

## 2021-12-03 MED ORDER — PROSOURCE PLUS PO LIQD
30.0000 mL | Freq: Three times a day (TID) | ORAL | Status: DC
  Administered 2021-12-03: 30 mL via ORAL
  Filled 2021-12-03: qty 30

## 2021-12-03 MED ORDER — POLYVINYL ALCOHOL 1.4 % OP SOLN: 1.0000 [drp] | Freq: Four times a day (QID) | OPHTHALMIC | 0 refills | Status: DC | PRN

## 2021-12-03 MED ORDER — POLYVINYL ALCOHOL 1.4 % OP SOLN: 1.0000 [drp] | Freq: Four times a day (QID) | OPHTHALMIC | Status: DC | PRN

## 2021-12-03 MED ORDER — HALOPERIDOL LACTATE 2 MG/ML PO CONC: 0.6000 mg | ORAL | 0 refills | Status: AC | PRN

## 2021-12-03 MED ORDER — GLYCOPYRROLATE 1 MG PO TABS: 1.0000 mg | ORAL_TABLET | ORAL | Status: DC | PRN

## 2021-12-03 MED ORDER — HALOPERIDOL LACTATE 2 MG/ML PO CONC: 0.5000 mg | ORAL | Status: DC | PRN

## 2021-12-03 MED ORDER — HALOPERIDOL LACTATE 5 MG/ML IJ SOLN: 0.5000 mg | INTRAMUSCULAR | Status: DC | PRN

## 2021-12-03 NOTE — Assessment & Plan Note (Signed)
Recent A1c 7.3.

## 2021-12-03 NOTE — Assessment & Plan Note (Signed)
Appreciate dietitian calorie count.  Overall prognosis poor.

## 2021-12-03 NOTE — Assessment & Plan Note (Signed)
Treated previously with antibiotics.

## 2021-12-03 NOTE — Assessment & Plan Note (Signed)
Currently going to hospice home.  No further treatment

## 2021-12-03 NOTE — TOC Transition Note (Signed)
Transition of Care [facility]) - CM/SW Discharge Note   Patient Details  Name: Ryosuke Denatale MRN: [id number] Date of Birth: [birth year]  Transition of Care [facility]) CM/SW Contact:  Anselm Pancoast, RN Phone Number: 12/03/2021, 3:44 PM   Clinical Narrative:    EMS contacted and set for discharge today. Currently 5th on the list. Confirmed COVID positive and discharging to [facility] today.      Barriers to Discharge: Barriers Resolved   Patient Goals and CMS Choice   CMS Medicare.gov Compare Post Acute Care list provided to:: Patient Represenative (must comment) Choice offered to / list presented to : Sibling  Discharge Placement                       Discharge Plan and Services   Discharge Planning Services: CM Consult            DME Arranged: N/A DME Agency: NA       HH Arranged: NA HH Agency: [place] Date [place]: 12/01/21 Time [place]: 1354 Representative spoke with at [place]: [place]  Social Determinants of Health ([place]) Interventions     Readmission Risk Interventions     View : No data to display.

## 2021-12-03 NOTE — Assessment & Plan Note (Signed)
Overall prognosis poor with poor appetite.

## 2021-12-03 NOTE — Assessment & Plan Note (Signed)
Patient will be on comfort care/hospice.  Stop Xarelto as this is a very old history of PE and no benefit at this point being on comfort care 

## 2021-12-03 NOTE — Progress Notes (Signed)
Initial Nutrition Assessment  DOCUMENTATION CODES:   Severe malnutrition in context of chronic illness  INTERVENTION:   -Initiate 48 hour calorie count per MD request -30 ml Prosource Plus TID, each supplement provides 100 kcals and 15 grams protein -MVI with minerals daily -Hormel Shake TID with meals, each supplement provides 520 kcals and 22 grams protein -Feeding assistance with meals  NUTRITION DIAGNOSIS:   Severe Malnutrition related to chronic illness (dementia) as evidenced by moderate fat depletion, severe fat depletion, moderate muscle depletion, severe muscle depletion.  GOAL:   Patient will meet greater than or equal to 90% of their needs  MONITOR:   PO intake, Supplement acceptance, Diet advancement  REASON FOR ASSESSMENT:   Consult Calorie Count  ASSESSMENT:   Pt with medical history significant of diet-controlled diabetes, dementia with aggressive behavior, fall, pulmonary embolism on Xarelto, who presents with altered mental status and fall.  Pt admitted with acute metabolic encephalopathy.    5/23- s/p BSE- dysphagia 3 diet with thin liquids  Reviewed I/O's: -130 ml x 24 hours and -6.6 L since admission  Per chart review, plan to return to facility [facility]) today with hospice care.   Pt very lethargic at time of visit. He did not respond to voice or touch. No family or caregivers at bedside to provide further history.   Pt with variable intake, noted meal completions 10-100% averaging about 50% of meals. Per RN, notes pt has been refusing PO's and medications at times. MD requesting calorie count. Observed breakfast tray- pt consumed about 50% of pancakes and 25% of sausage.   5/24-5/25 Breakfast: 189 kcals, 1 grams protein Lunch: 68 kcals, 3 grams protein Dinner: 272 kcals, 11 grams protein  Total intake: 529 kcal (30% of minimum estimated needs)  15 grams protein (17% of minimum estimated needs)  Calorie count results communicated to MD  and RNCM.   Reviewed wt hx; pt has experienced a 10.5% wt loss over the past 10 months, which is not significant for time frame, but concerning given advanced age, malnutrition, and decreased oral intake. Suspect poor oral intake and malnutrition is chronic given pt's physical exam.   Medications reviewed.   Lab Results  Component Value Date   HGBA1C 7.3 (H) 08/10/2021   PTA DM medications are none. Per ADA's Standards of Medical Care of Diabetes, glycemic targets for older adults who have multiple co-morbidities, cognitive impairments, and functional dependence should be less stringent (Hgb A1c <8.0-8.5).     Labs reviewed: CBGS: 129-179 (inpatient orders for glycemic control are 0-5 units insulin aspart daily at bedtime and 0-9 units insulin aspart TID with meals).    NUTRITION - FOCUSED PHYSICAL EXAM:  Flowsheet Row Most Recent Value  Orbital Region Severe depletion  Upper Arm Region Severe depletion  Thoracic and Lumbar Region Moderate depletion  Buccal Region Severe depletion  Temple Region Moderate depletion  Clavicle Bone Region Severe depletion  Clavicle and Acromion Bone Region Severe depletion  Scapular Bone Region Severe depletion  Dorsal Hand Moderate depletion  Patellar Region Severe depletion  Anterior Thigh Region Severe depletion  Posterior Calf Region Severe depletion  Edema (RD Assessment) None  Hair Reviewed  Eyes Reviewed  Mouth Reviewed  Skin Reviewed  Nails Reviewed       Diet Order:   Diet Order             DIET DYS 3 Room service appropriate? Yes with Assist; Fluid consistency: Thin  Diet effective now  Diet - low sodium heart healthy                   EDUCATION NEEDS:   Not appropriate for education at this time  Skin:  Skin Assessment: Skin Integrity Issues: Skin Integrity Issues:: Other (Comment), Stage III Stage III: lt buttocks Other: MASD to penus, open wound to scrotum, skin tears to rt posterior elbow and rt  posterior upper arm  Last BM:  11/26/21  Height:   Ht Readings from Last 1 Encounters:  11/25/21 5\' 10"  (1.778 m)    Weight:   Wt Readings from Last 1 Encounters:  11/25/21 65 kg    Ideal Body Weight:  75.5 kg  BMI:  Body mass index is 20.55 kg/m.  Estimated Nutritional Needs:   Kcal:  [id number]  Protein:  90-105 grams  Fluid:  > 1.7 L    Loistine Chance, RD, LDN, [place] Registered Dietitian II Certified Diabetes Care and Education Specialist Please refer to [facility] for RD and/or RD on-call/weekend/after hours pager

## 2021-12-03 NOTE — Progress Notes (Addendum)
[facility])   Upon further discussion with family, family has chosen to return to [facility] with [facility] to follow to provide hospice services.   Please send signed and completed DNR with patient/family upon discharge. Please provide prescriptions at discharge as needed to ensure ongoing symptom management and a transport packet.   Addendum: 1:21p Received request from Transitions of Care Manager Michelle Nasuti for family interest in Hospice Home. Visited patient at bedside and spoke with Jola Babinski to confirm interest and explain services.  Approval for Hospice Home is determined by [facility] MD. Once [facility] MD has determined Hospice Home eligibility, [facility] will update hospital staff and family. Eligibility is approved  Addendum: Unfortunately, patient tested positive for COVID-19 and is unable to transfer to [facility] until isolation period concludes. Patient will discharge to [facility] today with [facility] to follow.  [facility] information and contact numbers given to family and above information shared with TOC.    Please call with any questions/concerns.    Thank you for the opportunity to participate in this patient's care   Odette Fraction, MSW [facility] Liaison  [phone]

## 2021-12-03 NOTE — Assessment & Plan Note (Signed)
Pressure Injury 11/25/21 Buttocks Left Stage 3 -  Full thickness tissue loss. Subcutaneous fat may be visible but bone, tendon or muscle are NOT exposed. (Active)  11/25/21 1702  Location: Buttocks  Location Orientation: Left  Staging: Stage 3 -  Full thickness tissue loss. Subcutaneous fat may be visible but bone, tendon or muscle are NOT exposed.  Wound Description (Comments):   Present on Admission: Yes    Noted pressure ulcers on groin, penis and scrotum, stage II.  Present on admission.  Also with right elbow wound abrasions. Appreciate wound care consult

## 2021-12-03 NOTE — Progress Notes (Signed)
Patient asymptomatic tested positive for COVID so hospice home will not accept.  Discharge canceled.

## 2021-12-03 NOTE — Assessment & Plan Note (Signed)
Currently no signs of heart failure.

## 2021-12-03 NOTE — Assessment & Plan Note (Signed)
Improved with IV fluid hydration.  Last creatinine 1.01.  Creatinine peaked at 2.0

## 2021-12-03 NOTE — Discharge Summary (Addendum)
Physician Discharge Summary   Patient: Andre Lewis MRN: [id number] DOB: 1932-02-27  Admit date:     11/25/2021  Discharge date: 12/03/21  Discharge Physician: Alford Highland   PCP: System, Provider Not In   Recommendations at discharge:   Follow-up team at the hospice home 1 day  Discharge Diagnoses: Principal Problem:   Failure to thrive in adult Active Problems:   Protein-calorie malnutrition, severe   Senile dementia, delirium, with behavioral disturbance (HCC)   UTI (urinary tract infection)   AKI (acute kidney injury) (HCC)   Chronic diastolic CHF (congestive heart failure) (HCC)   Diabetes mellitus without complication (HCC)   Accelerated hypertension   Pressure injury of skin   Pulmonary embolism (HCC)   Goals of care, counseling/discussion   Hospice care patient    Hospital Course: 88 year old male with past medical history of diabetes mellitus diet-controlled, senile dementia and PE on Xarelto who was brought in from his skilled nursing facility on 5/17 after falling twice.  Normally patient is ambulatory with minimal assistance from cane and recognizes family members although patient according to his sister-in-law is quite confused and agitated.  Not his baseline.  In the emergency room, noted to have elevated blood pressures with systolic in the 170s.  CT scan of head with severe motion artifact and unable to fully rule out CVA.  Labs otherwise unremarkable other than some acute kidney injury.  Patient brought in for further evaluation and treatment.  Patient noted to have urinary retention and coud catheter placed. BNP on 5/18 elevated at 345 and patient started on some IV Lasix.  He had significant urinary output and pressures have improved.  Repeat urinalysis done 5/19 noted UTI which has grown out Pseudomonas.  Antibiotics adjusted s/op cefepime compelted 5/23.Patient has since been more awake and interactive and appropriate, although p.o. intake still remains  minimal.  [facility] assisted living could not take the patient back with a Foley catheter.  The catheter was discontinued and the patient was unable to urinate.  It assisted living could not take him back with in and out catheterizations.  Since the patient has poor oral intake and severe malnutrition, he was a candidate for the hospice facility.  Will be transferred to the hospice home on 12/03/2021.  Unfortunately the patient tested positive for COVID so the hospice home will not take.  [facility] assisted living stated that they can take the patient back with a Foley catheter now and they will take the patient back for comfort care measures and hospice following at the facility.  Assessment and Plan: * Failure to thrive in adult Patient with poor nutritional intake and as per calorie count only able to take in 30% of nutritional needs.  Patient is a candidate for the hospice.  Protein-calorie malnutrition, severe Appreciate dietitian calorie count.  Overall prognosis poor.  Senile dementia, delirium, with behavioral disturbance (HCC) Overall prognosis poor with poor appetite.  UTI (urinary tract infection) Treated previously with antibiotics.  Chronic diastolic CHF (congestive heart failure) (HCC) Currently no signs of heart failure.  AKI (acute kidney injury) (HCC) Improved with IV fluid hydration.  Last creatinine 1.01.  Creatinine peaked at 2.0  Accelerated hypertension Currently going to hospice home.  No further treatment  Diabetes mellitus without complication (HCC) Recent A1c 7.3.  Pressure injury of skin Pressure Injury 11/25/21 Buttocks Left Stage 3 -  Full thickness tissue loss. Subcutaneous fat may be visible but bone, tendon or muscle are NOT exposed. (Active)  11/25/21 1702  Location: Buttocks  Location Orientation: Left  Staging: Stage 3 -  Full thickness tissue loss. Subcutaneous fat may be visible but bone, tendon or muscle are NOT exposed.  Wound  Description (Comments):   Present on Admission: Yes    Noted pressure ulcers on groin, penis and scrotum, stage II.  Present on admission.  Also with right elbow wound abrasions. Appreciate wound care consult  Pulmonary embolism [facility]) Patient will be on comfort care/hospice.  Stop Xarelto as this is a very old history of PE and no benefit at this point being on comfort care  Covid positive- no treatment at this point.         Consultants: Hospice Procedures performed: None Disposition: Hospice at [facility]. Diet recommendation: As tolerated dysphagia 3 diet thin liquids. Discharge Diet Orders (From admission, onward)     Start     Ordered   12/01/21 0000  Diet - low sodium heart healthy        12/01/21 1520           As tolerated DISCHARGE MEDICATION: Allergies as of 12/03/2021   No Known Allergies      Medication List     STOP taking these medications    docusate sodium 100 MG capsule Commonly known as: COLACE   fluticasone 50 MCG/ACT nasal spray Commonly known as: FLONASE   magnesium oxide 400 MG tablet Commonly known as: MAG-OX   olopatadine 0.1 % ophthalmic solution Commonly known as: PATANOL   REFRESH LIQUIGEL OP   traMADol 50 MG tablet Commonly known as: ULTRAM   Vitamin D3 50 MCG (2000 UT) Tabs   Xarelto 15 MG Tabs tablet Generic drug: Rivaroxaban       TAKE these medications    acetaminophen 325 MG tablet Commonly known as: TYLENOL Take 2 tablets (650 mg total) by mouth every 6 (six) hours as needed for mild pain or fever. What changed:  when to take this reasons to take this   antiseptic oral rinse Liqd Apply 15 mLs topically as needed for dry mouth.   Desitin 13 % Crea Generic drug: Zinc Oxide Apply 1 application. topically daily as needed (for redness). Apply to sacrum   divalproex 125 MG DR tablet Commonly known as: DEPAKOTE Take 125 mg by mouth at bedtime.   glycopyrrolate 0.2 MG/ML injection Commonly known as:  ROBINUL Inject 1 mL (0.2 mg total) into the skin every 4 (four) hours as needed (excessive secretions).   haloperidol 2 MG/ML solution Commonly known as: HALDOL Place 0.3 mLs (0.6 mg total) under the tongue every 4 (four) hours as needed for agitation (or delirium).   hydrocortisone cream 1 % Apply 1 application. topically 2 (two) times daily as needed for itching. Apply to both arms   melatonin 3 MG Tabs tablet Take 3 mg by mouth at bedtime.   morphine CONCENTRATE 10 MG/0.5ML Soln concentrated solution Take 0.25 mLs (5 mg total) by mouth every 2 (two) hours as needed for severe pain or shortness of breath.   polyvinyl alcohol 1.4 % ophthalmic solution Commonly known as: LIQUIFILM TEARS Place 1 drop into both eyes 4 (four) times daily as needed for dry eyes.   tamsulosin 0.4 MG Caps capsule Commonly known as: FLOMAX Take 0.4 mg by mouth daily.   traZODone 50 MG tablet Commonly known as: DESYREL Take 75 mg by mouth at bedtime.        Discharge Exam: Filed Weights   11/25/21 0638  Weight: 65 kg   Physical  Exam HENT:     Head: Normocephalic.     Mouth/Throat:     Pharynx: No oropharyngeal exudate.  Eyes:     General: Lids are normal.     Conjunctiva/sclera: Conjunctivae normal.  Cardiovascular:     Rate and Rhythm: Normal rate and regular rhythm.     Heart sounds: Normal heart sounds, S1 normal and S2 normal.  Pulmonary:     Breath sounds: No decreased breath sounds, wheezing, rhonchi or rales.  Abdominal:     Palpations: Abdomen is soft.     Tenderness: There is no abdominal tenderness.  Musculoskeletal:     Right ankle: No swelling.     Left ankle: No swelling.  Skin:    General: Skin is warm.  Neurological:     Mental Status: He is lethargic.     Condition at discharge: Guarded  The results of significant diagnostics from this hospitalization (including imaging, microbiology, ancillary and laboratory) are listed below for reference.   Imaging  Studies: CT HEAD WO CONTRAST ( )  Result Date: 11/25/2021 CLINICAL DATA:  88 year old male status post witnessed fall. EXAM: CT HEAD WITHOUT CONTRAST TECHNIQUE: Contiguous axial images were obtained from the base of the skull through the vertex without intravenous contrast. RADIATION DOSE REDUCTION: This exam was performed according to the departmental dose-optimization program which includes automated exposure control, adjustment of the mA and/or kV according to patient size and/or use of iterative reconstruction technique. COMPARISON:  None Available. FINDINGS: Study is moderately degraded by motion artifact despite repeated imaging attempts. Brain: No midline shift, mass effect, or evidence of intracranial mass lesion. No ventriculomegaly. No acute intracranial hemorrhage identified. No definite acute cortically based infarct. Vascular: Calcified atherosclerosis at the skull base. No suspicious intracranial vascular hyperdensity. Skull: Degraded by motion.  No acute osseous abnormality identified. Sinuses/Orbits: Scattered bilateral mucoperiosteal thickening, most pronounced in the ethmoid and sphenoid sinuses. Tympanic cavities and mastoids appear clear. Other: No definite orbit or scalp soft tissue injury. IMPRESSION: 1. Study is moderately degraded by motion artifact despite repeated imaging attempts. 2. No acute intracranial abnormality or acute traumatic injury identified. 3. Bilateral paranasal sinus disease. Electronically Signed   By: Odessa Fleming M.D.   On: 11/25/2021 07:45   CT Cervical Spine Wo Contrast  Result Date: 11/25/2021 CLINICAL DATA:  88 year old male status post witnessed fall. EXAM: CT CERVICAL SPINE WITHOUT CONTRAST TECHNIQUE: Multidetector CT imaging of the cervical spine was performed without intravenous contrast. Multiplanar CT image reconstructions were also generated. RADIATION DOSE REDUCTION: This exam was performed according to the departmental dose-optimization program which  includes automated exposure control, adjustment of the mA and/or kV according to patient size and/or use of iterative reconstruction technique. COMPARISON:  Head CT today. FINDINGS: Study is moderately degraded by motion artifact despite repeated imaging attempts. Alignment: Straightening of cervical lordosis, mild reversal in the upper cervical spine. Cervicothoracic junction alignment is within normal limits. Posterior element alignment appears maintained. Skull base and vertebrae: Grossly intact skull base. Maintained C1-C2 alignment. Limited cervical vertebral detail due to motion. No acute osseous abnormality identified. Soft tissues and spinal canal: No prevertebral fluid or swelling. No visible canal hematoma. Bilateral calcified carotid atherosclerosis. Disc levels: Widespread advanced disc and endplate degeneration. Spinal stenosis appears most pronounced at C3-C4. Upper chest: Mild chronic appearing anterior wedging of the T1 vertebral body. Osteopenia. Lung apices are clear. IMPRESSION: 1. Moderately degraded by motion artifact despite repeated imaging attempts. 2. No acute traumatic injury identified in the cervical spine. 3. Widespread cervical  spine degeneration. Evidence of spinal stenosis at C3-C4. Electronically Signed   By: Odessa Fleming M.D.   On: 11/25/2021 07:48   DG Chest Port 1 View  Result Date: 11/27/2021 CLINICAL DATA:  Pneumonia EXAM: PORTABLE CHEST 1 VIEW COMPARISON:  None Available. FINDINGS: Midline trachea. Mild cardiomegaly. Atherosclerosis in the transverse aorta. Trace left pleural fluid. No pneumothorax. Mild hyperinflation. Linear right infrahilar opacity likely represents scar. Minimal left lower lobe airspace disease. IMPRESSION: Trace left pleural fluid with mild left base airspace disease which could represent atelectasis or infection/aspiration. Cardiomegaly without congestive failure. Aortic Atherosclerosis (ICD10-I70.0). Electronically Signed   By: Jeronimo Greaves M.D.   On:  11/27/2021 08:44   ECHOCARDIOGRAM COMPLETE  Result Date: 11/27/2021    ECHOCARDIOGRAM REPORT   Patient Name:   DARRIE MACMILLAN Date of Exam: 11/27/2021 Medical Rec #:  [id number]    Height:       70.0 in Accession #:    [id number]   Weight:       143.2 lb Date of Birth:  [birth year]     BSA:          1.811 m Patient Age:    88 years     BP:           123/63 mmHg Patient Gender: M            HR:           78 bpm. Exam Location:  [facility] Procedure: 2D Echo, Color Doppler and Cardiac Doppler Indications:     CHF-acute diastolic I50.31  History:         Patient has no prior history of Echocardiogram examinations.                  Risk Factors:Diabetes. Pulmonary embolus.  Sonographer:     Cristela Blue Referring Phys:  [id number] SENDIL K [facility] Diagnosing Phys: Alwyn Pea MD  Sonographer Comments: Technically challenging study due to limited acoustic windows, no apical window and no parasternal window. Image acquisition challenging due to patient body habitus and Very bony thorax---only view obtainable was subcostal. IMPRESSIONS  1. Left ventricular ejection fraction, by estimation, is >75%. The left ventricle has hyperdynamic function. The left ventricle has no regional wall motion abnormalities. There is mild concentric left ventricular hypertrophy. Left ventricular diastolic parameters are consistent with Grade I diastolic dysfunction (impaired relaxation).  2. Right ventricular systolic function is normal. The right ventricular size is normal. Mildly increased right ventricular wall thickness.  3. The mitral valve is grossly normal. Mild mitral valve regurgitation.  4. The aortic valve is grossly normal. Aortic valve regurgitation is mild. FINDINGS  Left Ventricle: Left ventricular ejection fraction, by estimation, is >75%. The left ventricle has hyperdynamic function. The left ventricle has no regional wall motion abnormalities. The left ventricular internal cavity size was normal in size. There is mild concentric left  ventricular hypertrophy. Left ventricular diastolic parameters are consistent with Grade I diastolic dysfunction (impaired relaxation). Right Ventricle: The right ventricular size is normal. Mildly increased right ventricular wall thickness. Right ventricular systolic function is normal. Left Atrium: Left atrial size was normal in size. Right Atrium: Right atrial size was normal in size. Pericardium: Trivial pericardial effusion is present. Mitral Valve: The mitral valve is grossly normal. Mild mitral valve regurgitation. Tricuspid Valve: The tricuspid valve is normal in structure. Tricuspid valve regurgitation is mild. Aortic Valve: The aortic valve is grossly normal. Aortic valve regurgitation is mild. Pulmonic Valve: The pulmonic valve was normal  in structure. Pulmonic valve regurgitation is trivial. Aorta: The ascending aorta was not well visualized. IAS/Shunts: No atrial level shunt detected by color flow Doppler.  LEFT VENTRICLE PLAX 2D LVIDd:         3.90 cm LVIDs:         2.20 cm LV PW:         1.20 cm LV IVS:        1.00 cm  LEFT ATRIUM         Index LA diam:    4.30 cm 2.37 cm/m  PULMONIC VALVE PV Vmax:        1.16 m/s PV Vmean:       88.900 cm/s PV VTI:         0.132 m PV Peak grad:   5.4 mmHg PV Mean grad:   4.0 mmHg RVOT Peak grad: 7 mmHg   SHUNTS Pulmonic VTI: 0.165 m Alwyn Peawayne D Callwood MD Electronically signed by Alwyn Peawayne D Callwood MD Signature Date/Time: 11/27/2021/11:38:47 PM    Final     Microbiology: Results for orders placed or performed during the hospital encounter of 11/25/21  MRSA Next Gen by PCR, Nasal     Status: Abnormal   Collection Time: 11/27/21  2:35 AM   Specimen: Nasal Mucosa; Nasal Swab  Result Value Ref Range Status   MRSA by PCR Next Gen DETECTED (A) NOT DETECTED Final    Comment: RESULT CALLED TO, READ BACK BY AND VERIFIED WITH: PATRICK TUTTLE AT 0414 11/27/21.PMF (NOTE) The GeneXpert MRSA Assay (FDA approved for NASAL specimens only), is one component of a  comprehensive MRSA colonization surveillance program. It is not intended to diagnose MRSA infection nor to guide or monitor treatment for MRSA infections. Test performance is not FDA approved in patients less than 86 years old. Performed at [facility], [address] [place], [place] [address]   Urine Culture     Status: Abnormal   Collection Time: 11/27/21  9:10 AM   Specimen: Urine, Clean Catch  Result Value Ref Range Status   Specimen Description   Final    URINE, CLEAN CATCH Performed at [facility], [address] [place], [place] [address]    Special Requests   Final    NONE Performed at [facility], [address] [place], [place] [address]    Culture >=100,000 COLONIES/mL PSEUDOMONAS AERUGINOSA (A)  Final   Report Status 11/29/2021 FINAL  Final   Organism ID, Bacteria PSEUDOMONAS AERUGINOSA (A)  Final      Susceptibility   Pseudomonas aeruginosa - MIC*    CEFTAZIDIME 4 SENSITIVE Sensitive     CIPROFLOXACIN <=0.25 SENSITIVE Sensitive     GENTAMICIN <=1 SENSITIVE Sensitive     IMIPENEM 1 SENSITIVE Sensitive     PIP/TAZO 8 SENSITIVE Sensitive     CEFEPIME 2 SENSITIVE Sensitive     * >=100,000 COLONIES/mL PSEUDOMONAS AERUGINOSA    Labs: CBC: Recent Labs  Lab 11/27/21 0403 11/28/21 0438  WBC 5.9 4.7  HGB 11.4* 11.1*  HCT 33.5* 32.9*  MCV 99.7 100.6*  PLT 134* 130*   Basic Metabolic Panel: Recent Labs  Lab 11/28/21 0438 11/29/21 0637 11/30/21 0357 12/01/21 0503 12/02/21 0556  NA 140 140 138 139 140  K 3.8 3.7 3.3* 3.8 3.5  CL 106 107 103 103 103  CO2 26 26 29 29 28   GLUCOSE 156* 157* 154* 137* 143*  BUN 32* 27* 27* 30* 33*  CREATININE 1.24 1.01 0.97 1.10 1.01  CALCIUM 9.2 9.1 9.1 9.1 9.2     CBG: Recent Labs  Lab 12/02/21 1127 12/02/21 1700 12/02/21 2031 12/03/21 0818 12/03/21 1151  GLUCAP 129* 169* 179* 151* 139*    Discharge time spent: greater than 30 minutes.  Signed: Alford Highland, MD [facility] 12/03/2021

## 2021-12-03 NOTE — Assessment & Plan Note (Signed)
Patient with poor nutritional intake and as per calorie count only able to take in 30% of nutritional needs.  Patient is a candidate for the hospice home.

## 2022-01-09 DEATH — deceased

## 2023-08-10 IMAGING — CT CT CERVICAL SPINE W/O CM
4 of 8 series · 11 of 33 positions shown, 12 images · non-contrast
Comparison: Head CT today.

CLINICAL DATA: 89-year-old male status post witnessed fall.



[Series 7: coronal bone · coronal · 0.26mm/px · 2 of 58 slices shown]
[im 3/58  bone]
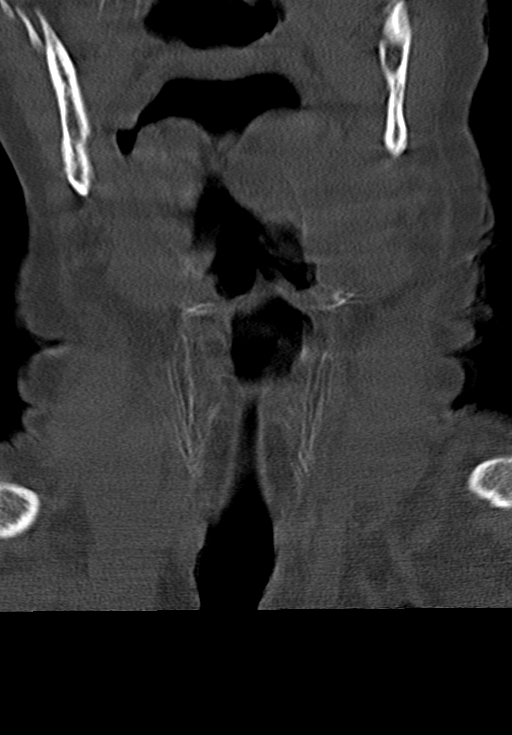
[im 30/58  bone]
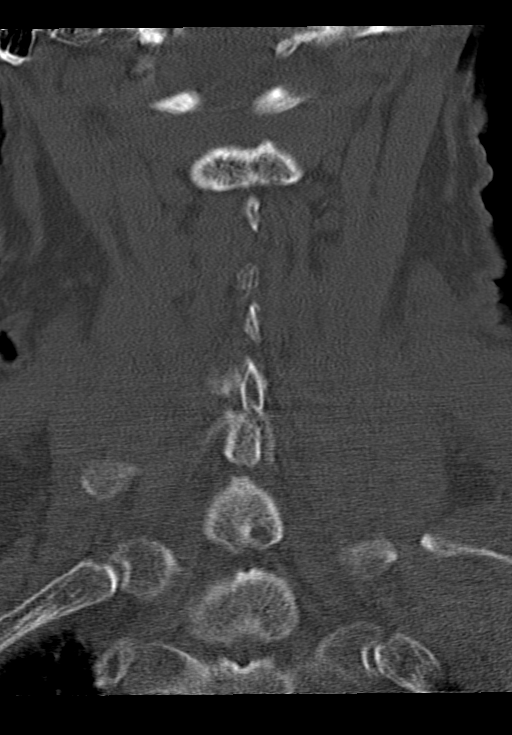

[Series 8: orthogonal bone · axial · 0.22mm/px · z∈[-218,-160]mm · 2 of 95 slices shown (1 of 2)]
[im 32/95  bone]
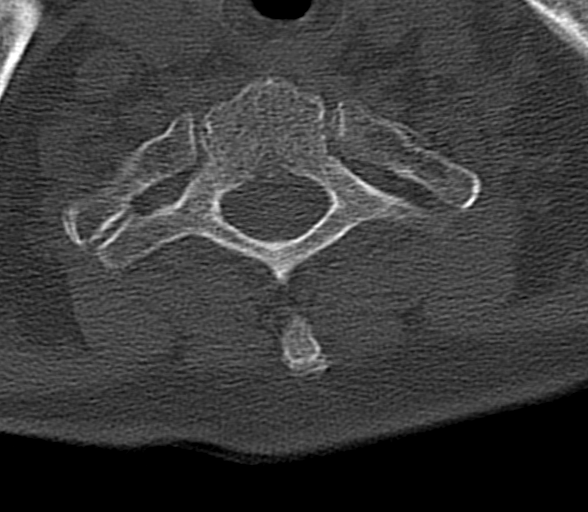
[im 63/95  bone]
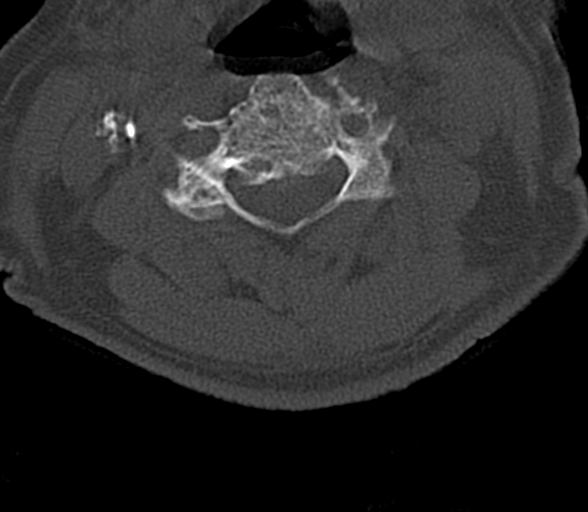

[Series 13: sagittal bone · sagittal · 0.31mm/px · 5 of 65 slices shown]
[im 11/65  bone]
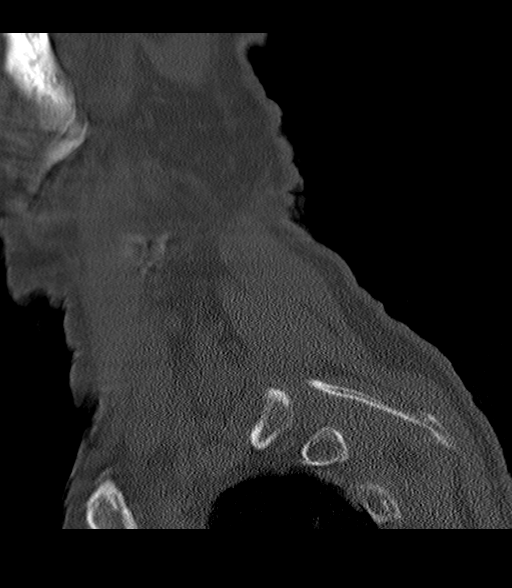
[im 22/65  bone]
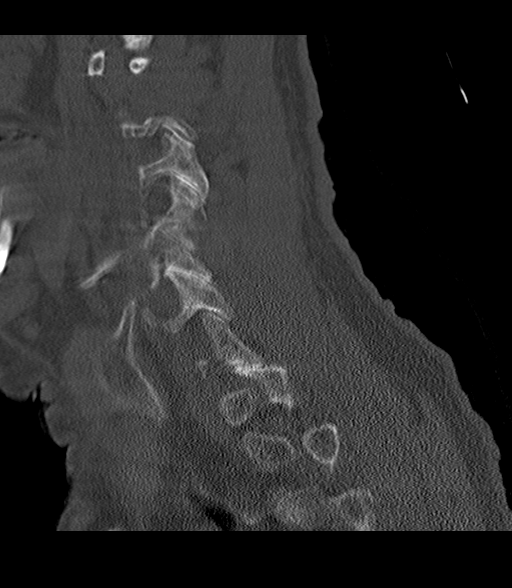
[im 33/65  bone]
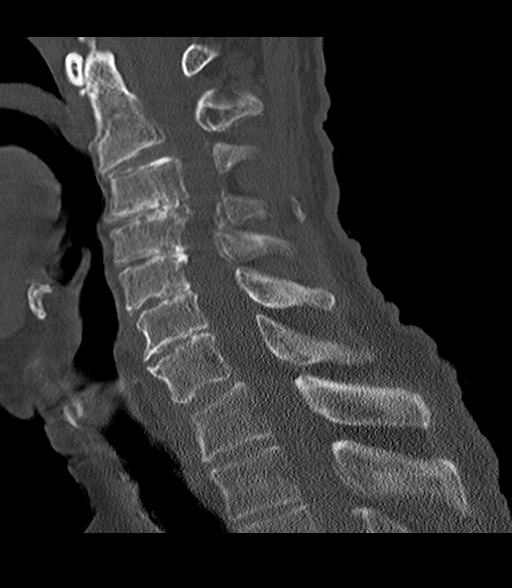
[im 43/65  bone]
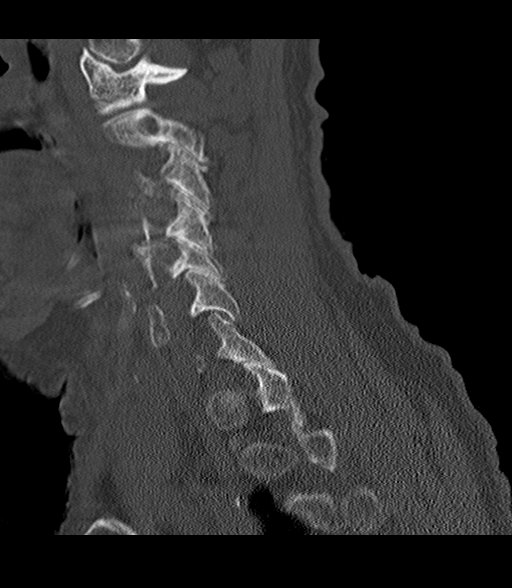
[im 54/65  bone]
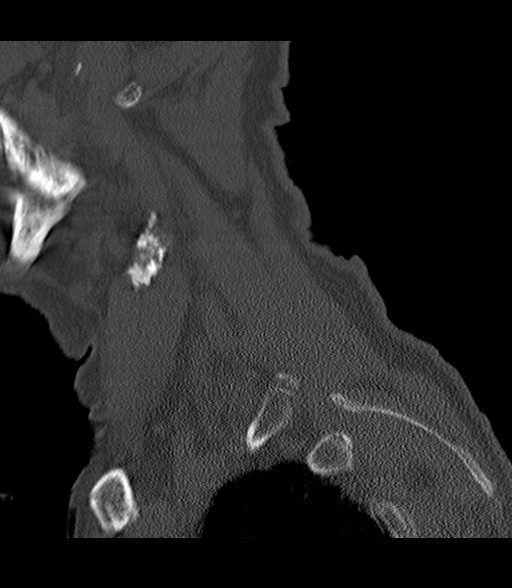

[Series 15: orthogonal bone · axial · 0.23mm/px · z∈[-222,-160]mm · 2 of 104 slices shown, 3 images (2 of 2)]
[im 35/104  soft-tissue]
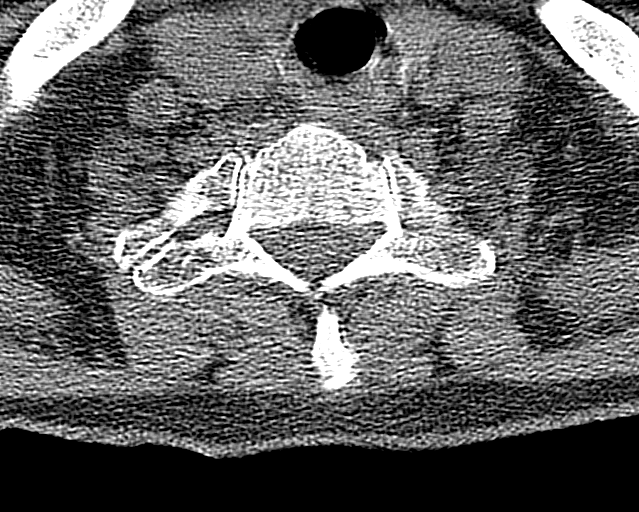
[im 35/104  bone]
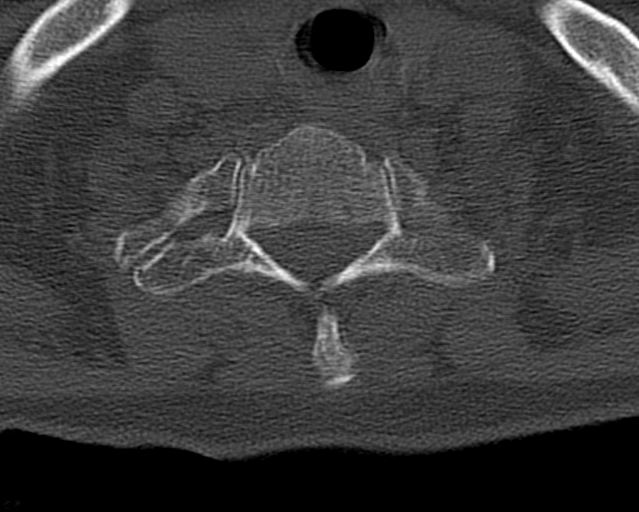
[im 69/104  bone]
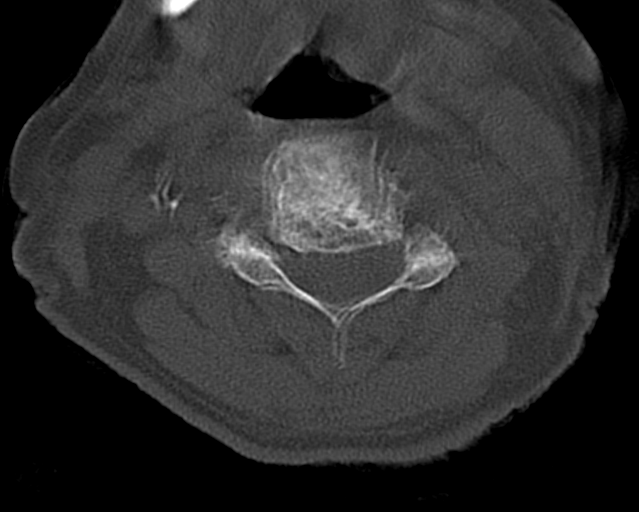

[11 of 33 positions shown; findings below may reference images not displayed]

FINDINGS: Study is moderately degraded by motion artifact despite repeated
imaging attempts.

Alignment: Straightening of cervical lordosis, mild reversal in the
upper cervical spine. Cervicothoracic junction alignment is within
normal limits. Posterior element alignment appears maintained.

Skull base and vertebrae: Grossly intact skull base. Maintained
C1-C2 alignment.

Limited cervical vertebral detail due to motion. No acute osseous
abnormality identified.

Soft tissues and spinal canal: No prevertebral fluid or swelling. No
visible canal hematoma. Bilateral calcified carotid atherosclerosis.

Disc levels: Widespread advanced disc and endplate degeneration.
Spinal stenosis appears most pronounced at C3-C4.

Upper chest: Mild chronic appearing anterior wedging of the T1
vertebral body. Osteopenia. Lung apices are clear.
IMPRESSION: 1. Moderately degraded by motion artifact despite repeated imaging
attempts.
2. No acute traumatic injury identified in the cervical spine.
3. Widespread cervical spine degeneration. Evidence of spinal
stenosis at C3-C4.

## 2023-08-10 IMAGING — CT CT HEAD W/O CM
5 of 8 series · 17 of 47 positions shown, 18 images · non-contrast
Comparison: None Available.

CLINICAL DATA: 89-year-old male status post witnessed fall.



[Series 2: head wo · axial · 0.39mm/px · z∈[-81,-31]mm · 2 of 31 slices shown, 3 images (1 of 2)]
[im 11/31  brain]
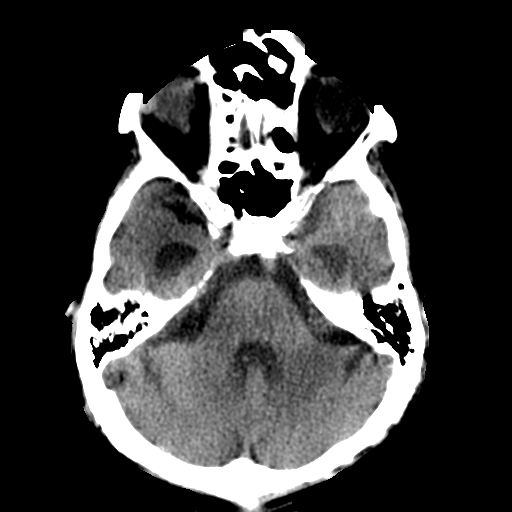
[im 11/31  bone]
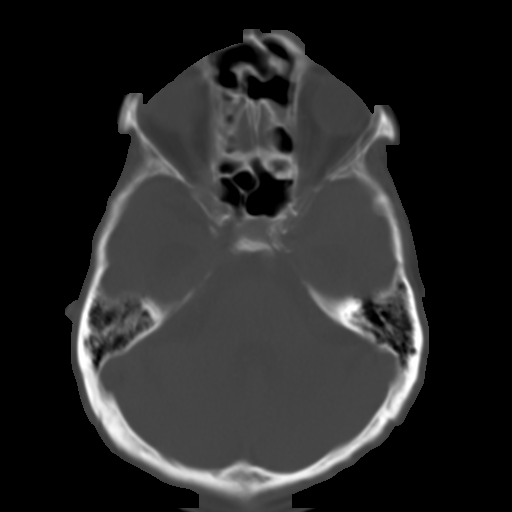
[im 21/31  brain]
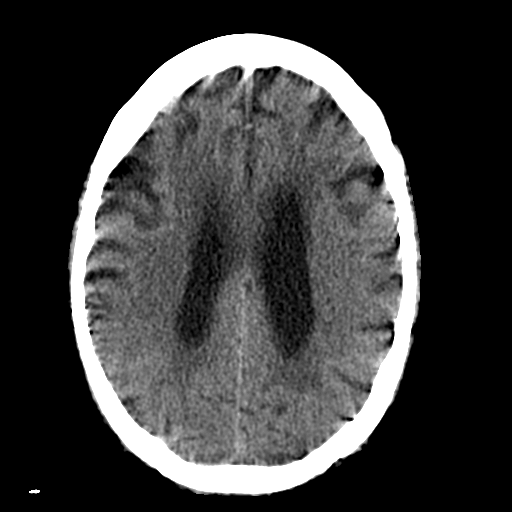

[Series 3: head bone · axial · 0.39mm/px · z∈[-117,-11]mm · 7 of 77 slices shown]
[im 8/77  bone]
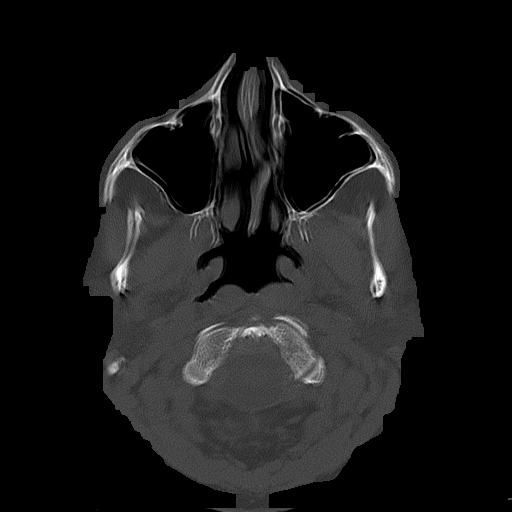
[im 16/77  bone]
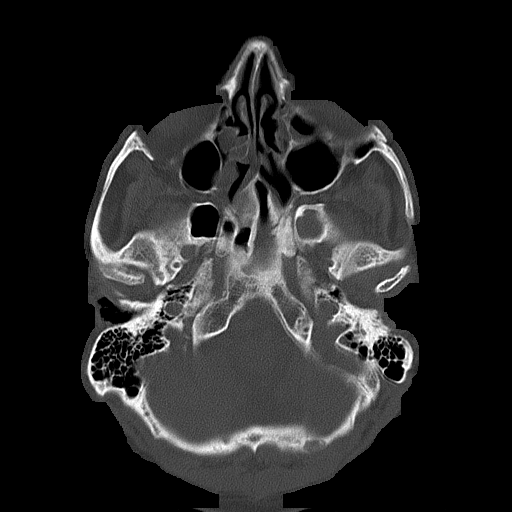
[im 23/77  bone]
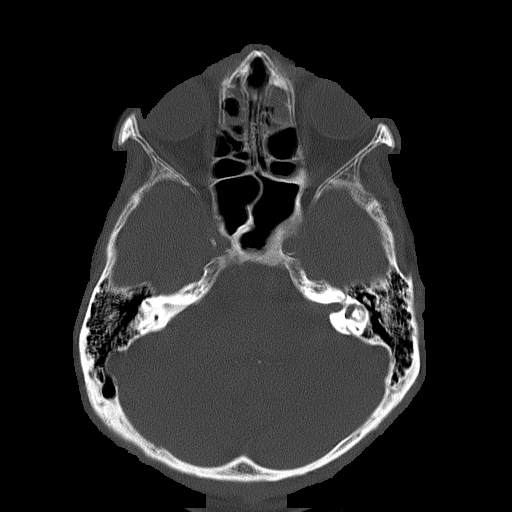
[im 31/77  bone]
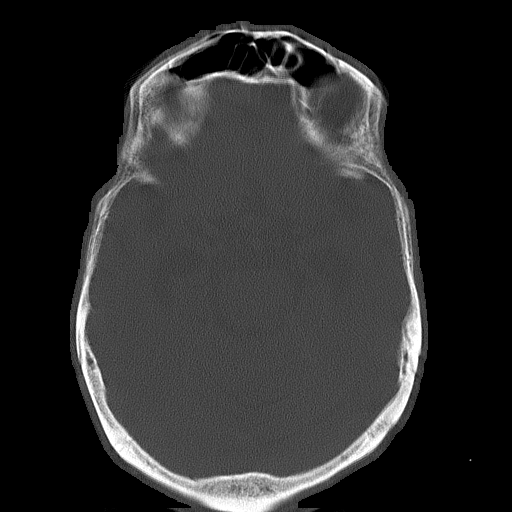
[im 46/77  bone]
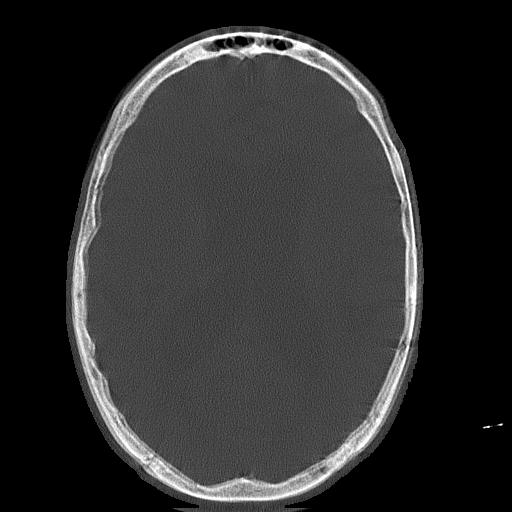
[im 54/77  bone]
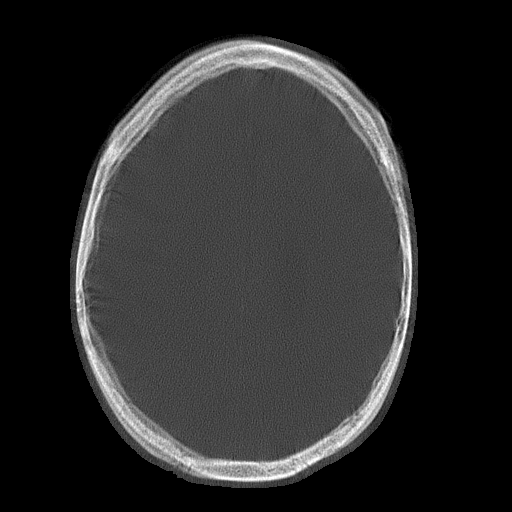
[im 61/77  bone]
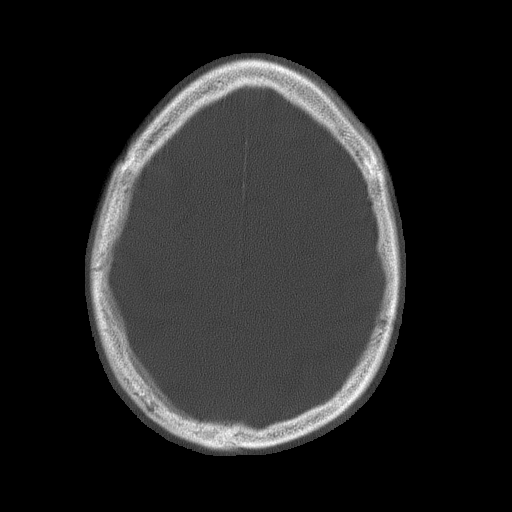

[Series 4: coronal soft tissue · coronal · 0.30mm/px · 3 of 69 slices shown]
[im 18/69  brain]
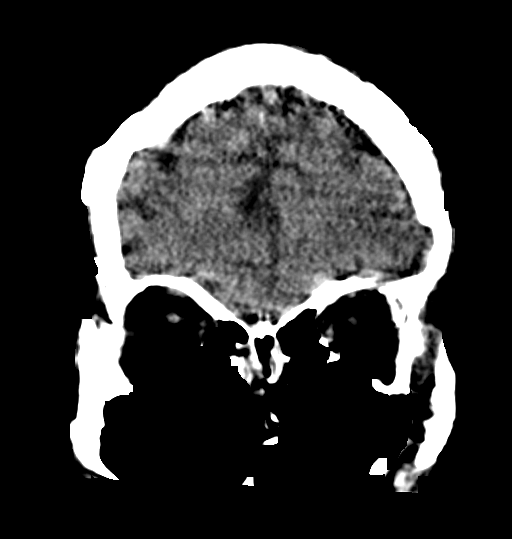
[im 35/69  brain]
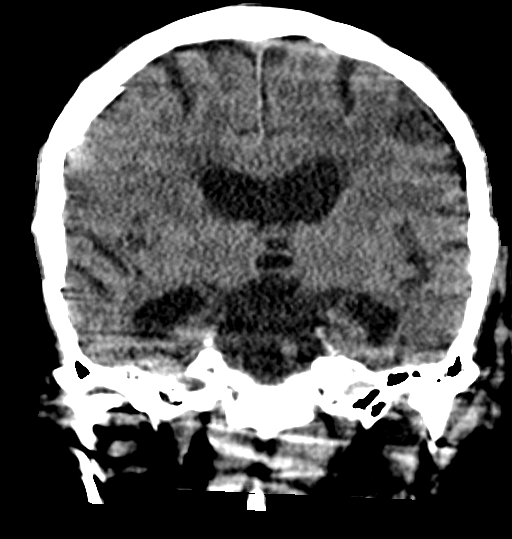
[im 52/69  brain]
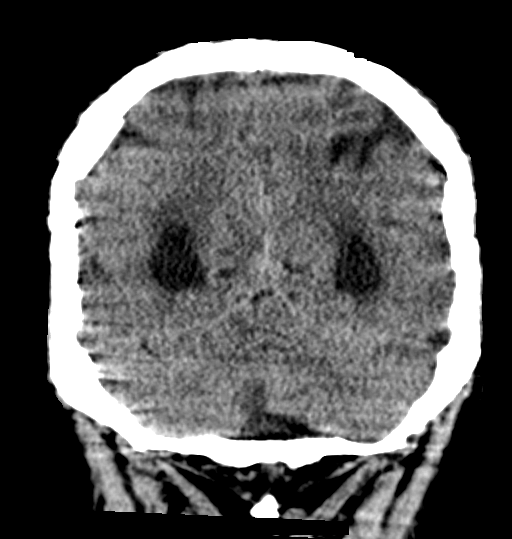

[Series 5: head wo · axial · 0.40mm/px · z∈[-81,-31]mm · 2 of 31 slices shown (2 of 2)]
[im 11/31  brain]
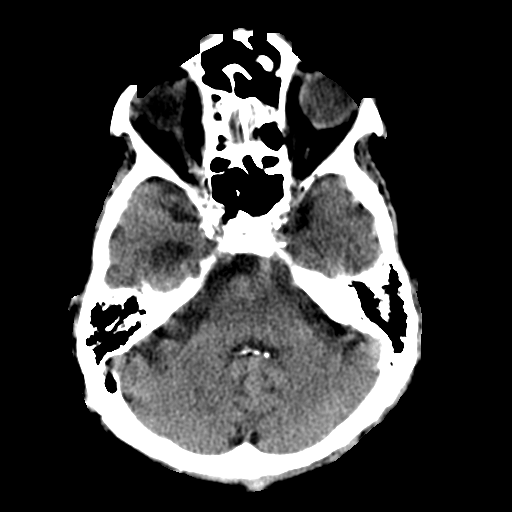
[im 21/31  brain]
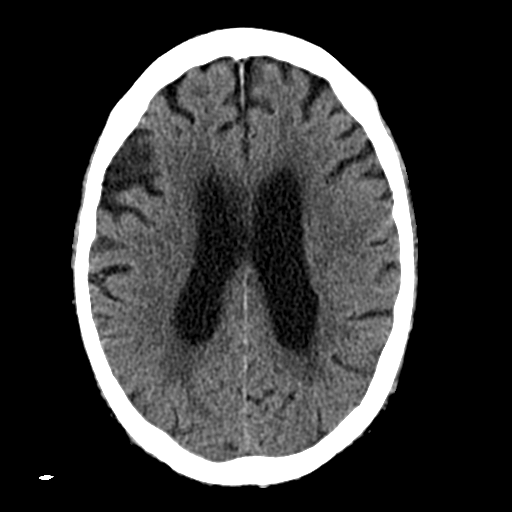

[Series 7: sagittal soft tissue · sagittal · 0.31mm/px · 3 of 50 slices shown]
[im 9/50  brain]
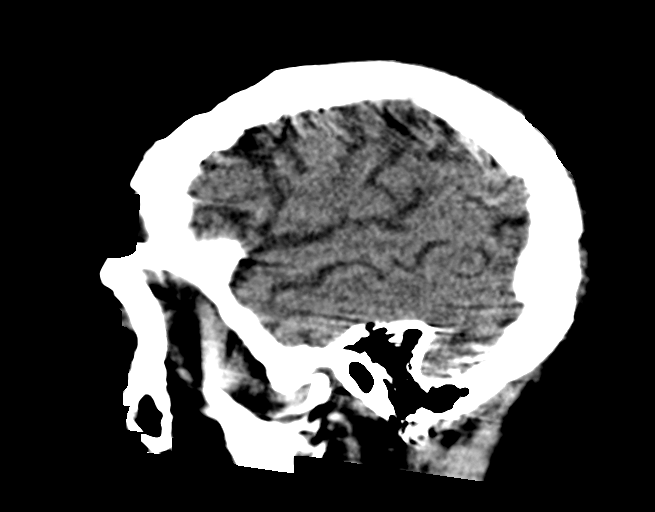
[im 22/50  brain]
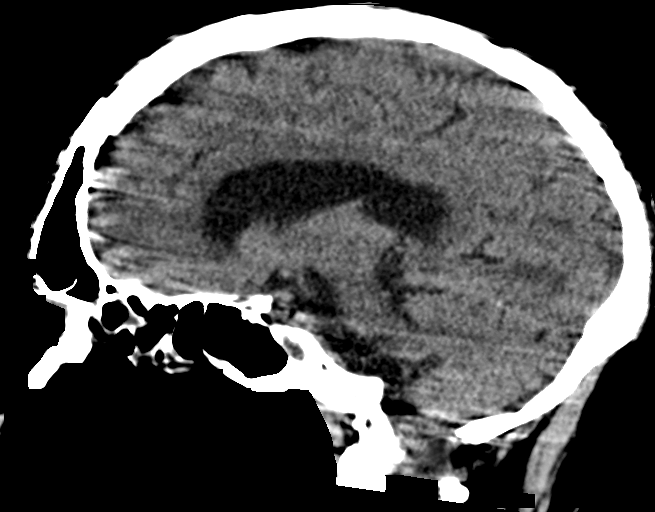
[im 36/50  brain]
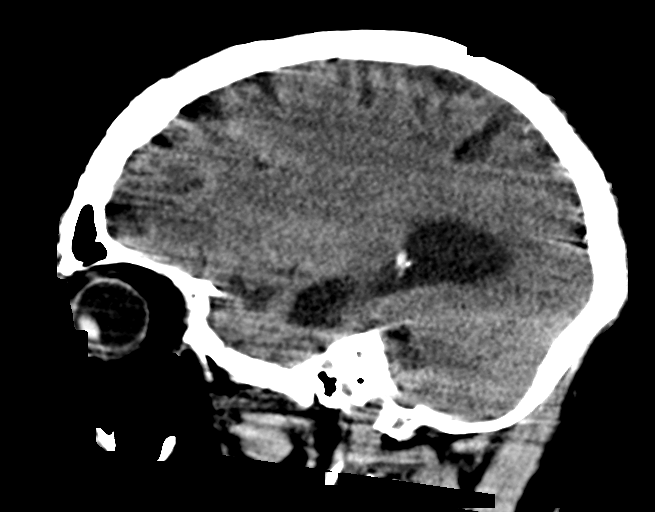

[17 of 47 positions shown; findings below may reference images not displayed]

FINDINGS: Study is moderately degraded by motion artifact despite repeated
imaging attempts.

Brain: No midline shift, mass effect, or evidence of intracranial
mass lesion. No ventriculomegaly. No acute intracranial hemorrhage
identified. No definite acute cortically based infarct.

Vascular: Calcified atherosclerosis at the skull base. No suspicious
intracranial vascular hyperdensity.

Skull: Degraded by motion.  No acute osseous abnormality identified.

Sinuses/Orbits: Scattered bilateral mucoperiosteal thickening, most
pronounced in the ethmoid and sphenoid sinuses. Tympanic cavities
and mastoids appear clear.

Other: No definite orbit or scalp soft tissue injury.
IMPRESSION: 1. Study is moderately degraded by motion artifact despite repeated
imaging attempts.
2. No acute intracranial abnormality or acute traumatic injury
identified.
3. Bilateral paranasal sinus disease.

## 2023-08-12 IMAGING — DX DG CHEST 1V PORT
1 series · 1 of 1 positions shown · non-contrast
Comparison: None Available.

CLINICAL DATA: Pneumonia

EXAM:
PORTABLE CHEST 1 VIEW

[chest ap]
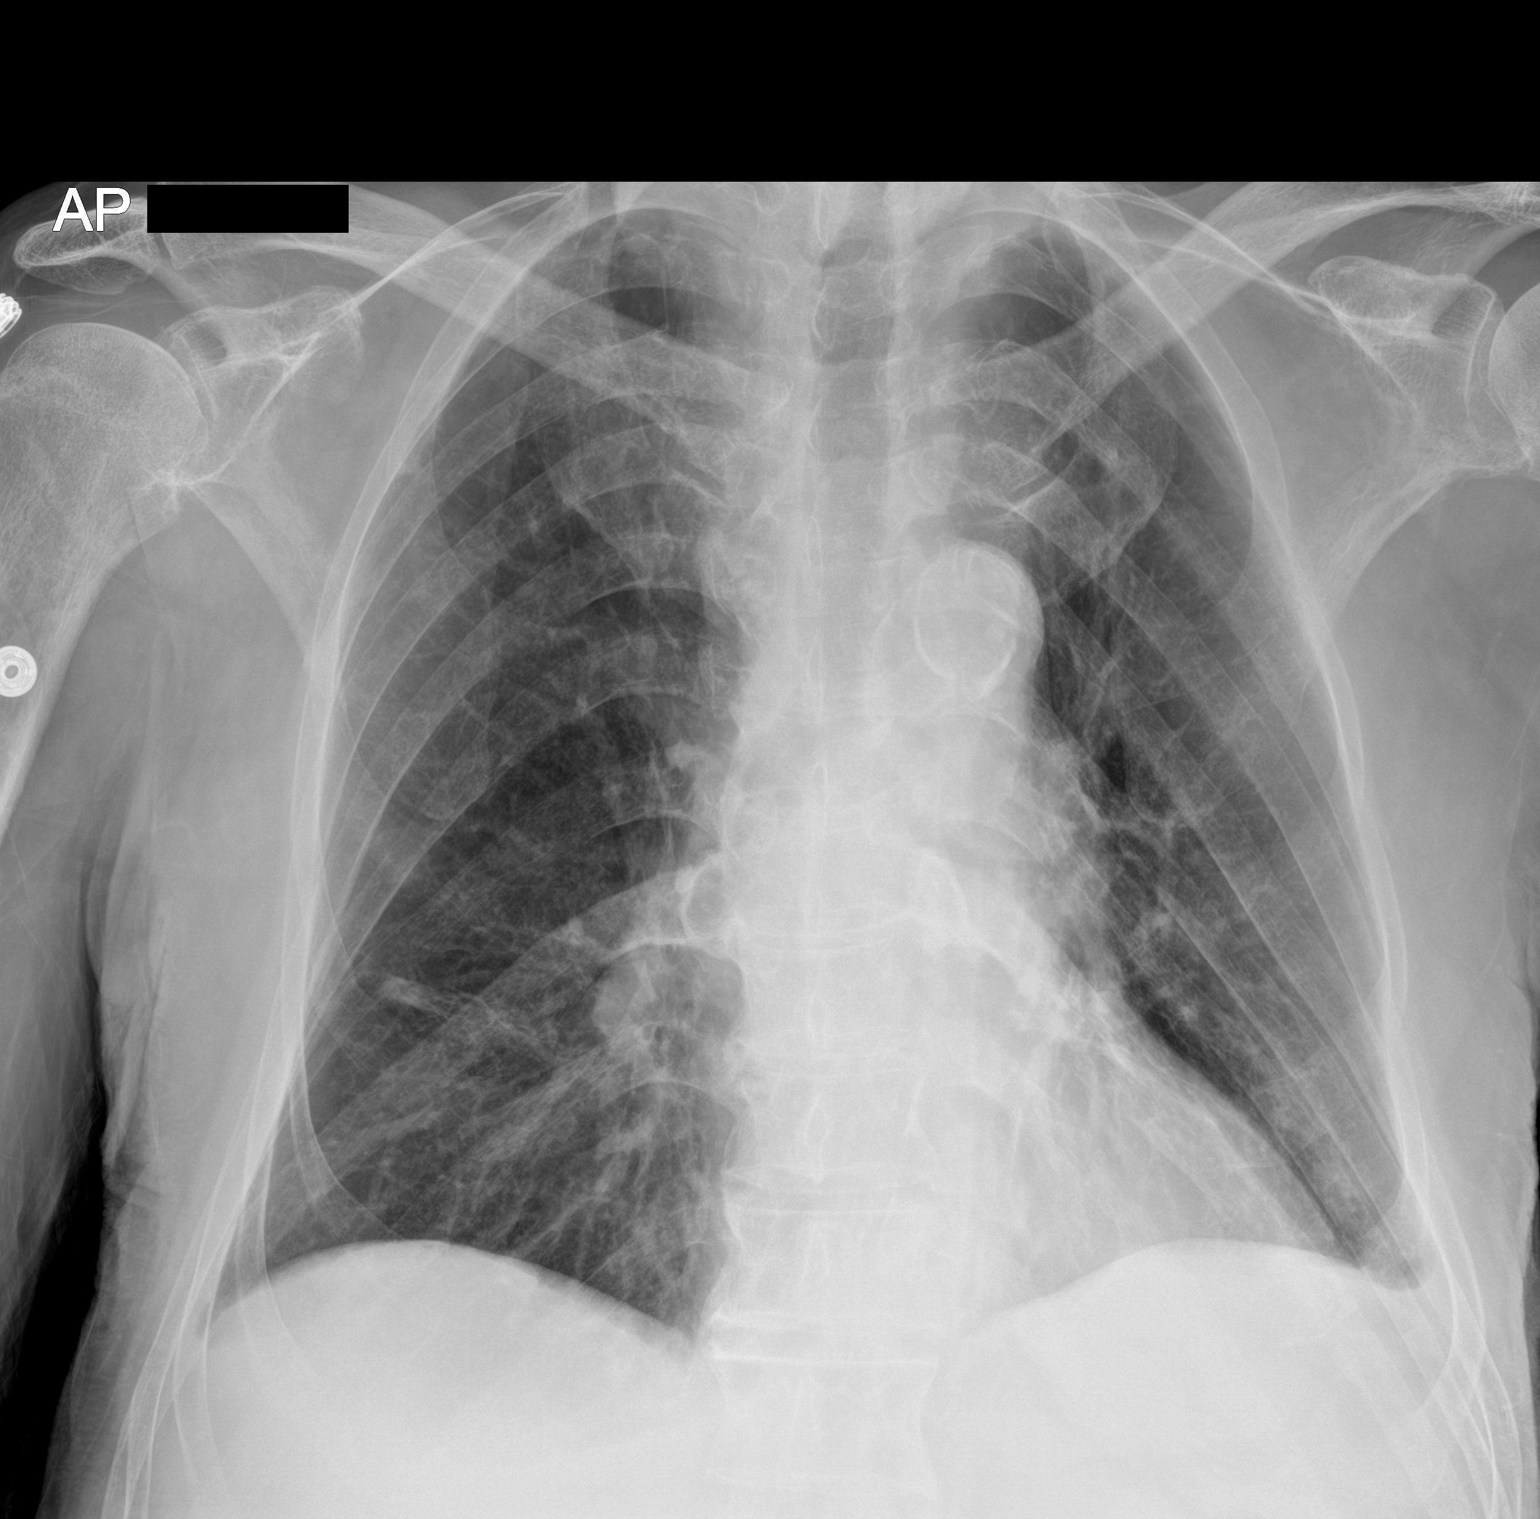

[1 of 1 positions shown; findings below may reference images not displayed]

FINDINGS: Midline trachea. Mild cardiomegaly. Atherosclerosis in the
transverse aorta. Trace left pleural fluid. No pneumothorax. Mild
hyperinflation. Linear right infrahilar opacity likely represents
scar. Minimal left lower lobe airspace disease.
IMPRESSION: Trace left pleural fluid with mild left base airspace disease which
could represent atelectasis or infection/aspiration.

Cardiomegaly without congestive failure.

Aortic Atherosclerosis ([id number].X).
